# Patient Record
Sex: Female | Born: 1950 | Race: White | Hispanic: No | Smoking: Former smoker
Health system: Southern US, Community
[De-identification: ages and names within clinical notes are randomized; demographics above are authoritative.]

## PROBLEM LIST (undated history)

## (undated) DIAGNOSIS — H5440 Blindness, one eye, unspecified eye: Secondary | ICD-10-CM

## (undated) DIAGNOSIS — R87619 Unspecified abnormal cytological findings in specimens from cervix uteri: Secondary | ICD-10-CM

## (undated) DIAGNOSIS — M1990 Unspecified osteoarthritis, unspecified site: Secondary | ICD-10-CM

## (undated) DIAGNOSIS — E039 Hypothyroidism, unspecified: Secondary | ICD-10-CM

## (undated) DIAGNOSIS — C801 Malignant (primary) neoplasm, unspecified: Secondary | ICD-10-CM

## (undated) DIAGNOSIS — K219 Gastro-esophageal reflux disease without esophagitis: Secondary | ICD-10-CM

## (undated) DIAGNOSIS — G43109 Migraine with aura, not intractable, without status migrainosus: Secondary | ICD-10-CM

## (undated) DIAGNOSIS — E079 Disorder of thyroid, unspecified: Secondary | ICD-10-CM

## (undated) HISTORY — DX: Blindness, one eye, unspecified eye: H54.40

## (undated) HISTORY — DX: Unspecified abnormal cytological findings in specimens from cervix uteri: R87.619

## (undated) HISTORY — DX: Migraine with aura, not intractable, without status migrainosus: G43.109

## (undated) HISTORY — DX: Disorder of thyroid, unspecified: E07.9

---

## 1987-02-27 DIAGNOSIS — C801 Malignant (primary) neoplasm, unspecified: Secondary | ICD-10-CM

## 1987-02-27 HISTORY — DX: Malignant (primary) neoplasm, unspecified: C80.1

## 1987-02-27 HISTORY — PX: GYNECOLOGIC CRYOSURGERY: SHX857

## 1988-02-27 DIAGNOSIS — R87619 Unspecified abnormal cytological findings in specimens from cervix uteri: Secondary | ICD-10-CM

## 1988-02-27 HISTORY — DX: Unspecified abnormal cytological findings in specimens from cervix uteri: R87.619

## 1999-02-27 HISTORY — PX: OTHER SURGICAL HISTORY: SHX169

## 2003-02-16 ENCOUNTER — Ambulatory Visit (HOSPITAL_COMMUNITY): Admission: RE | Admit: 2003-02-16 | Discharge: 2003-02-16 | Payer: Self-pay | Admitting: Family Medicine

## 2003-06-23 ENCOUNTER — Other Ambulatory Visit: Admission: RE | Admit: 2003-06-23 | Discharge: 2003-06-23 | Payer: Self-pay | Admitting: Obstetrics and Gynecology

## 2003-06-23 LAB — HM PAP SMEAR

## 2004-03-20 ENCOUNTER — Ambulatory Visit (HOSPITAL_COMMUNITY): Admission: RE | Admit: 2004-03-20 | Discharge: 2004-03-20 | Payer: Self-pay | Admitting: Obstetrics and Gynecology

## 2005-05-01 ENCOUNTER — Ambulatory Visit (HOSPITAL_COMMUNITY): Admission: RE | Admit: 2005-05-01 | Discharge: 2005-05-01 | Payer: Self-pay | Admitting: Obstetrics and Gynecology

## 2005-06-12 ENCOUNTER — Other Ambulatory Visit: Admission: RE | Admit: 2005-06-12 | Discharge: 2005-06-12 | Payer: Self-pay | Admitting: Obstetrics and Gynecology

## 2005-07-27 HISTORY — PX: AUGMENTATION MAMMAPLASTY: SUR837

## 2005-08-11 ENCOUNTER — Ambulatory Visit: Admission: RE | Admit: 2005-08-11 | Discharge: 2005-08-11 | Payer: Self-pay | Admitting: Emergency Medicine

## 2006-05-07 ENCOUNTER — Ambulatory Visit (HOSPITAL_COMMUNITY): Admission: RE | Admit: 2006-05-07 | Discharge: 2006-05-07 | Payer: Self-pay | Admitting: Obstetrics and Gynecology

## 2007-05-08 ENCOUNTER — Ambulatory Visit (HOSPITAL_COMMUNITY): Admission: RE | Admit: 2007-05-08 | Discharge: 2007-05-08 | Payer: Self-pay | Admitting: Obstetrics and Gynecology

## 2008-06-25 ENCOUNTER — Ambulatory Visit (HOSPITAL_COMMUNITY): Admission: RE | Admit: 2008-06-25 | Discharge: 2008-06-25 | Payer: Self-pay | Admitting: Internal Medicine

## 2008-06-26 LAB — HM COLONOSCOPY

## 2009-09-16 ENCOUNTER — Ambulatory Visit (HOSPITAL_COMMUNITY): Admission: RE | Admit: 2009-09-16 | Discharge: 2009-09-16 | Payer: Self-pay | Admitting: Obstetrics and Gynecology

## 2010-03-19 ENCOUNTER — Encounter: Payer: Self-pay | Admitting: Obstetrics and Gynecology

## 2010-10-06 ENCOUNTER — Other Ambulatory Visit: Payer: Self-pay | Admitting: Obstetrics and Gynecology

## 2010-10-06 DIAGNOSIS — Z1231 Encounter for screening mammogram for malignant neoplasm of breast: Secondary | ICD-10-CM

## 2010-10-24 ENCOUNTER — Ambulatory Visit (HOSPITAL_COMMUNITY)
Admission: RE | Admit: 2010-10-24 | Discharge: 2010-10-24 | Disposition: A | Discharge status: Home / Self Care | Payer: BC Managed Care – PPO | Source: Ambulatory Visit | Attending: Obstetrics and Gynecology | Admitting: Obstetrics and Gynecology

## 2010-10-24 DIAGNOSIS — Z1231 Encounter for screening mammogram for malignant neoplasm of breast: Secondary | ICD-10-CM

## 2011-04-27 DIAGNOSIS — G43109 Migraine with aura, not intractable, without status migrainosus: Secondary | ICD-10-CM

## 2011-04-27 HISTORY — DX: Migraine with aura, not intractable, without status migrainosus: G43.109

## 2011-11-28 ENCOUNTER — Other Ambulatory Visit: Payer: Self-pay | Admitting: Obstetrics and Gynecology

## 2011-11-28 DIAGNOSIS — Z1231 Encounter for screening mammogram for malignant neoplasm of breast: Secondary | ICD-10-CM

## 2011-12-14 ENCOUNTER — Ambulatory Visit (HOSPITAL_COMMUNITY)
Admission: RE | Admit: 2011-12-14 | Discharge: 2011-12-14 | Disposition: A | Discharge status: Home / Self Care | Payer: BC Managed Care – PPO | Source: Ambulatory Visit | Attending: Obstetrics and Gynecology | Admitting: Obstetrics and Gynecology

## 2011-12-14 DIAGNOSIS — Z1231 Encounter for screening mammogram for malignant neoplasm of breast: Secondary | ICD-10-CM | POA: Insufficient documentation

## 2011-12-14 LAB — HM MAMMOGRAPHY

## 2012-11-28 ENCOUNTER — Encounter: Payer: Self-pay | Admitting: Obstetrics and Gynecology

## 2012-11-28 ENCOUNTER — Ambulatory Visit: Payer: Self-pay | Admitting: Obstetrics and Gynecology

## 2012-12-05 ENCOUNTER — Other Ambulatory Visit (HOSPITAL_COMMUNITY): Payer: Self-pay | Admitting: Internal Medicine

## 2012-12-05 DIAGNOSIS — Z1231 Encounter for screening mammogram for malignant neoplasm of breast: Secondary | ICD-10-CM

## 2012-12-16 ENCOUNTER — Telehealth: Payer: Self-pay | Admitting: Obstetrics and Gynecology

## 2012-12-16 NOTE — Telephone Encounter (Signed)
I have taken care of her NS charge. I was wondering if any other patients have mentioned this about choosing the option to cancel but still getting NS charge?

## 2012-12-16 NOTE — Telephone Encounter (Signed)
The log of calls including any cancel prompts on the reminder call system comes to Arna Medici and me in a report by email daily. We then cancel the appointment from that report and call the patient to reschedule. For some reason, I do not have a report for that dos??? But for future reference, we have a log where we keep the reports.

## 2012-12-16 NOTE — Telephone Encounter (Signed)
Patient is calling about a bill she received for a no show charge on 10/03 she said when the automated system called she prompted to cancel the appt but it didn't cancel in our system.

## 2012-12-24 ENCOUNTER — Ambulatory Visit (HOSPITAL_COMMUNITY)
Admission: RE | Admit: 2012-12-24 | Discharge: 2012-12-24 | Disposition: A | Discharge status: Home / Self Care | Payer: BC Managed Care – PPO | Source: Ambulatory Visit | Attending: Internal Medicine | Admitting: Internal Medicine

## 2012-12-24 DIAGNOSIS — Z1231 Encounter for screening mammogram for malignant neoplasm of breast: Secondary | ICD-10-CM

## 2013-10-26 ENCOUNTER — Other Ambulatory Visit: Payer: Self-pay | Admitting: Gastroenterology

## 2013-10-29 ENCOUNTER — Encounter (HOSPITAL_COMMUNITY): Payer: Self-pay | Admitting: Pharmacy Technician

## 2013-11-03 ENCOUNTER — Encounter (HOSPITAL_COMMUNITY): Payer: Self-pay | Admitting: *Deleted

## 2013-11-11 NOTE — Anesthesia Preprocedure Evaluation (Addendum)
Anesthesia Evaluation  Patient identified by MRN, date of birth, ID band Patient awake    Reviewed: Allergy & Precautions, H&P , NPO status , Patient's Chart, lab work & pertinent test results  History of Anesthesia Complications Negative for: history of anesthetic complications  Airway Mallampati: II TM Distance: >3 FB Neck ROM: Full    Dental no notable dental hx.    Pulmonary former smoker,  breath sounds clear to auscultation  Pulmonary exam normal       Cardiovascular negative cardio ROS  Rhythm:Regular Rate:Normal     Neuro/Psych negative neurological ROS  negative psych ROS   GI/Hepatic Neg liver ROS, GERD-  Medicated and Controlled,  Endo/Other  Hypothyroidism   Renal/GU negative Renal ROS  negative genitourinary   Musculoskeletal  (+) Arthritis -, Osteoarthritis,    Abdominal   Peds negative pediatric ROS (+)  Hematology negative hematology ROS (+)   Anesthesia Other Findings   Reproductive/Obstetrics negative OB ROS                          Anesthesia Physical Anesthesia Plan  ASA: II  Anesthesia Plan: MAC   Post-op Pain Management:    Induction: Intravenous  Airway Management Planned: Nasal Cannula  Additional Equipment:   Intra-op Plan:   Post-operative Plan:   Informed Consent: I have reviewed the patients History and Physical, chart, labs and discussed the procedure including the risks, benefits and alternatives for the proposed anesthesia with the patient or authorized representative who has indicated his/her understanding and acceptance.   Dental advisory given  Plan Discussed with: CRNA  Anesthesia Plan Comments:         Anesthesia Quick Evaluation

## 2013-11-12 ENCOUNTER — Encounter (HOSPITAL_COMMUNITY): Payer: BC Managed Care – PPO | Admitting: Anesthesiology

## 2013-11-12 ENCOUNTER — Ambulatory Visit (HOSPITAL_COMMUNITY): Payer: BC Managed Care – PPO | Admitting: Anesthesiology

## 2013-11-12 ENCOUNTER — Encounter (HOSPITAL_COMMUNITY)
Admission: RE | Disposition: A | Discharge status: Home / Self Care | Payer: Self-pay | Source: Ambulatory Visit | Attending: Gastroenterology

## 2013-11-12 ENCOUNTER — Encounter (HOSPITAL_COMMUNITY): Payer: Self-pay

## 2013-11-12 ENCOUNTER — Ambulatory Visit (HOSPITAL_COMMUNITY)
Admission: RE | Admit: 2013-11-12 | Discharge: 2013-11-12 | Disposition: A | Discharge status: Home / Self Care | Payer: BC Managed Care – PPO | Source: Ambulatory Visit | Attending: Gastroenterology | Admitting: Gastroenterology

## 2013-11-12 DIAGNOSIS — M129 Arthropathy, unspecified: Secondary | ICD-10-CM | POA: Diagnosis not present

## 2013-11-12 DIAGNOSIS — E039 Hypothyroidism, unspecified: Secondary | ICD-10-CM | POA: Diagnosis not present

## 2013-11-12 DIAGNOSIS — Z860100 Personal history of colon polyps, unspecified: Secondary | ICD-10-CM | POA: Insufficient documentation

## 2013-11-12 DIAGNOSIS — Z1211 Encounter for screening for malignant neoplasm of colon: Secondary | ICD-10-CM | POA: Diagnosis present

## 2013-11-12 DIAGNOSIS — Z87891 Personal history of nicotine dependence: Secondary | ICD-10-CM | POA: Diagnosis not present

## 2013-11-12 DIAGNOSIS — D126 Benign neoplasm of colon, unspecified: Secondary | ICD-10-CM | POA: Insufficient documentation

## 2013-11-12 HISTORY — DX: Gastro-esophageal reflux disease without esophagitis: K21.9

## 2013-11-12 HISTORY — PX: COLONOSCOPY WITH PROPOFOL: SHX5780

## 2013-11-12 HISTORY — DX: Malignant (primary) neoplasm, unspecified: C80.1

## 2013-11-12 HISTORY — DX: Hypothyroidism, unspecified: E03.9

## 2013-11-12 HISTORY — PX: OTHER SURGICAL HISTORY: SHX169

## 2013-11-12 HISTORY — DX: Unspecified osteoarthritis, unspecified site: M19.90

## 2013-11-12 LAB — HM COLONOSCOPY

## 2013-11-12 SURGERY — COLONOSCOPY WITH PROPOFOL
Anesthesia: Monitor Anesthesia Care

## 2013-11-12 MED ORDER — PROPOFOL 10 MG/ML IV BOLUS
INTRAVENOUS | Status: DC | PRN
  Administered 2013-11-12: 50 mg via INTRAVENOUS

## 2013-11-12 MED ORDER — SODIUM CHLORIDE 0.9 % IV SOLN: INTRAVENOUS | Status: DC

## 2013-11-12 MED ORDER — LIDOCAINE HCL (CARDIAC) 20 MG/ML IV SOLN
INTRAVENOUS | Status: DC | PRN
  Administered 2013-11-12: 50 mg via INTRAVENOUS

## 2013-11-12 MED ORDER — PROPOFOL INFUSION 10 MG/ML OPTIME
INTRAVENOUS | Status: DC | PRN
  Administered 2013-11-12: 120 ug/kg/min via INTRAVENOUS

## 2013-11-12 MED ORDER — LACTATED RINGERS IV SOLN
INTRAVENOUS | Status: DC
  Administered 2013-11-12: 10:00:00 via INTRAVENOUS

## 2013-11-12 MED ORDER — PROPOFOL 10 MG/ML IV BOLUS
INTRAVENOUS | Status: AC
  Filled 2013-11-12: qty 20

## 2013-11-12 MED ORDER — LIDOCAINE HCL (CARDIAC) 20 MG/ML IV SOLN
INTRAVENOUS | Status: AC
  Filled 2013-11-12: qty 5

## 2013-11-12 SURGICAL SUPPLY — 21 items

## 2013-11-12 NOTE — Anesthesia Postprocedure Evaluation (Signed)
  Anesthesia Post-op Note  Patient: Kristin Gentry  Procedure(s) Performed: Procedure(s) (LRB): COLONOSCOPY WITH PROPOFOL (N/A)  Patient Location: PACU  Anesthesia Type: MAC  Level of Consciousness: awake and alert   Airway and Oxygen Therapy: Patient Spontanous Breathing  Post-op Pain: mild  Post-op Assessment: Post-op Vital signs reviewed, Patient's Cardiovascular Status Stable, Respiratory Function Stable, Patent Airway and No signs of Nausea or vomiting  Last Vitals:  Filed Vitals:   11/12/13 1137  BP: 122/99  Pulse: 91  Temp:   Resp: 18    Post-op Vital Signs: stable   Complications: No apparent anesthesia complications

## 2013-11-12 NOTE — Op Note (Signed)
Procedure: Surveillance colonoscopy. Normal surveillance colonoscopy performed on 07/30/2008. 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  Endoscopist: Earle Gell  Premedication: Propofol administered by anesthesia  Procedure: The patient was placed in the left lateral decubitus position. Anal inspection and digital rectal exam were normal. The Pentax pediatric colonoscope was introduced into the rectum and advanced to the cecum. A normal-appearing appendiceal orifice was identified. A normal-appearing ileocecal valve was identified. Colonic preparation for the exam today was good.  Rectum. Normal. Retroflex view of the distal rectum normal  Sigmoid colon. From the mid sigmoid colon, a 4 mm sessile polyp was removed with the cold snare and cold biopsy forceps.  Descending colon. Normal  Splenic flexure. Normal  Transverse colon. From the mid transverse colon, two  5 mm sessile polyps were removed with the cold snare.  Hepatic flexure. Normal  Ascending colon. Normal  Cecum and ileocecal valve. Normal  Assessment: A 4 mm sessile polyp was removed from the sigmoid colon and two 5 mm sessile polyps were removed from the mid transverse colon. Otherwise normal colonoscopy.  Recommendation: Schedule repeat surveillance colonoscopy in 5 years

## 2013-11-12 NOTE — H&P (Signed)
  Procedure: Surveillance colonoscopy. Normal surveillance colonoscopy performed on 6/4/210. 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  History: The patient is a 63 year old female born [birth year]. She is scheduled to undergo a surveillance colonoscopy today. In 2007, she underwent a colonoscopy with removal of a rectal tubulovillous adenomatous polyp which was complicated by a post colonoscopic polypectomy bleed. She underwent a normal surveillance colonoscopy in June 2010.  Past medical history: Hypothyroidism. Osteopenia. Cervical cancer. Total abdominal hysterectomy. Bilateral salpingo-oophorectomy. Saline breast implants.  Medication allergies: None  Exam: The patient is alert and lying comfortably on the endoscopy stretcher. Abdomen is soft and nontender to palpation. Lungs are clear to auscultation. Cardiac exam reveals a regular rhythm.  Plan: Proceed with surveillance colonoscopy

## 2013-11-12 NOTE — Discharge Instructions (Signed)

## 2013-11-12 NOTE — Transfer of Care (Signed)
Immediate Anesthesia Transfer of Care Note  Patient: Kristin Gentry  Procedure(s) Performed: Procedure(s): COLONOSCOPY WITH PROPOFOL (N/A)  Patient Location: PACU  Anesthesia Type:MAC  Level of Consciousness: awake, alert  and oriented  Airway & Oxygen Therapy: Patient Spontanous Breathing and Patient connected to face mask oxygen  Post-op Assessment: Report given to PACU RN and Post -op Vital signs reviewed and stable  Post vital signs: Reviewed and stable  Complications: No apparent anesthesia complications

## 2013-11-13 ENCOUNTER — Encounter (HOSPITAL_COMMUNITY): Payer: Self-pay | Admitting: Gastroenterology

## 2013-12-28 ENCOUNTER — Encounter (HOSPITAL_COMMUNITY): Payer: Self-pay | Admitting: Gastroenterology

## 2015-12-13 ENCOUNTER — Other Ambulatory Visit: Payer: Self-pay | Admitting: Internal Medicine

## 2015-12-13 DIAGNOSIS — Z1231 Encounter for screening mammogram for malignant neoplasm of breast: Secondary | ICD-10-CM

## 2015-12-13 DIAGNOSIS — Z9882 Breast implant status: Secondary | ICD-10-CM

## 2015-12-29 ENCOUNTER — Ambulatory Visit
Admission: RE | Admit: 2015-12-29 | Discharge: 2015-12-29 | Disposition: A | Discharge status: Home / Self Care | Payer: BLUE CROSS/BLUE SHIELD | Source: Ambulatory Visit | Attending: Internal Medicine | Admitting: Internal Medicine

## 2015-12-29 DIAGNOSIS — Z9882 Breast implant status: Secondary | ICD-10-CM

## 2015-12-29 DIAGNOSIS — Z1231 Encounter for screening mammogram for malignant neoplasm of breast: Secondary | ICD-10-CM

## 2016-11-26 ENCOUNTER — Other Ambulatory Visit: Payer: Self-pay | Admitting: Internal Medicine

## 2016-11-26 DIAGNOSIS — Z1239 Encounter for other screening for malignant neoplasm of breast: Secondary | ICD-10-CM

## 2016-12-03 DIAGNOSIS — M810 Age-related osteoporosis without current pathological fracture: Secondary | ICD-10-CM | POA: Diagnosis not present

## 2016-12-03 DIAGNOSIS — M8588 Other specified disorders of bone density and structure, other site: Secondary | ICD-10-CM | POA: Diagnosis not present

## 2016-12-11 DIAGNOSIS — M810 Age-related osteoporosis without current pathological fracture: Secondary | ICD-10-CM | POA: Diagnosis not present

## 2016-12-20 DIAGNOSIS — M810 Age-related osteoporosis without current pathological fracture: Secondary | ICD-10-CM | POA: Diagnosis not present

## 2016-12-31 ENCOUNTER — Ambulatory Visit
Admission: RE | Admit: 2016-12-31 | Discharge: 2016-12-31 | Disposition: A | Discharge status: Home / Self Care | Payer: Medicare Other | Source: Ambulatory Visit | Attending: Internal Medicine | Admitting: Internal Medicine

## 2016-12-31 DIAGNOSIS — Z1231 Encounter for screening mammogram for malignant neoplasm of breast: Secondary | ICD-10-CM | POA: Diagnosis not present

## 2016-12-31 DIAGNOSIS — Z1239 Encounter for other screening for malignant neoplasm of breast: Secondary | ICD-10-CM

## 2017-01-03 DIAGNOSIS — S52225D Nondisplaced transverse fracture of shaft of left ulna, subsequent encounter for closed fracture with routine healing: Secondary | ICD-10-CM | POA: Diagnosis not present

## 2017-01-03 DIAGNOSIS — M25532 Pain in left wrist: Secondary | ICD-10-CM | POA: Diagnosis not present

## 2017-04-12 DIAGNOSIS — J029 Acute pharyngitis, unspecified: Secondary | ICD-10-CM | POA: Diagnosis not present

## 2017-05-14 DIAGNOSIS — R69 Illness, unspecified: Secondary | ICD-10-CM | POA: Diagnosis not present

## 2017-05-19 DIAGNOSIS — Z0100 Encounter for examination of eyes and vision without abnormal findings: Secondary | ICD-10-CM | POA: Diagnosis not present

## 2017-07-16 ENCOUNTER — Observation Stay (HOSPITAL_COMMUNITY): Payer: Medicare HMO | Admitting: Certified Registered Nurse Anesthetist

## 2017-07-16 ENCOUNTER — Encounter (HOSPITAL_COMMUNITY): Admission: EM | Disposition: A | Discharge status: Home / Self Care | Payer: Self-pay | Source: Home / Self Care

## 2017-07-16 ENCOUNTER — Inpatient Hospital Stay (HOSPITAL_COMMUNITY)
Admission: EM | Admit: 2017-07-16 | Discharge: 2017-07-20 | DRG: 342 | Disposition: A | Discharge status: Home / Self Care | Payer: Medicare HMO | Attending: Surgery | Admitting: Surgery

## 2017-07-16 ENCOUNTER — Other Ambulatory Visit: Payer: Self-pay | Admitting: Internal Medicine

## 2017-07-16 ENCOUNTER — Emergency Department (HOSPITAL_COMMUNITY): Payer: Medicare HMO

## 2017-07-16 ENCOUNTER — Encounter (HOSPITAL_COMMUNITY): Payer: Self-pay

## 2017-07-16 ENCOUNTER — Other Ambulatory Visit: Payer: Self-pay

## 2017-07-16 DIAGNOSIS — M1712 Unilateral primary osteoarthritis, left knee: Secondary | ICD-10-CM | POA: Diagnosis present

## 2017-07-16 DIAGNOSIS — K3532 Acute appendicitis with perforation and localized peritonitis, without abscess: Secondary | ICD-10-CM | POA: Diagnosis not present

## 2017-07-16 DIAGNOSIS — Z90710 Acquired absence of both cervix and uterus: Secondary | ICD-10-CM

## 2017-07-16 DIAGNOSIS — E039 Hypothyroidism, unspecified: Secondary | ICD-10-CM | POA: Diagnosis present

## 2017-07-16 DIAGNOSIS — Z833 Family history of diabetes mellitus: Secondary | ICD-10-CM | POA: Diagnosis not present

## 2017-07-16 DIAGNOSIS — K352 Acute appendicitis with generalized peritonitis, without abscess: Secondary | ICD-10-CM | POA: Diagnosis not present

## 2017-07-16 DIAGNOSIS — K449 Diaphragmatic hernia without obstruction or gangrene: Secondary | ICD-10-CM | POA: Diagnosis not present

## 2017-07-16 DIAGNOSIS — R1011 Right upper quadrant pain: Secondary | ICD-10-CM | POA: Diagnosis not present

## 2017-07-16 DIAGNOSIS — Z79890 Hormone replacement therapy (postmenopausal): Secondary | ICD-10-CM | POA: Diagnosis not present

## 2017-07-16 DIAGNOSIS — R1030 Lower abdominal pain, unspecified: Secondary | ICD-10-CM | POA: Diagnosis not present

## 2017-07-16 DIAGNOSIS — Z8541 Personal history of malignant neoplasm of cervix uteri: Secondary | ICD-10-CM

## 2017-07-16 DIAGNOSIS — Z8249 Family history of ischemic heart disease and other diseases of the circulatory system: Secondary | ICD-10-CM | POA: Diagnosis not present

## 2017-07-16 DIAGNOSIS — K3580 Unspecified acute appendicitis: Secondary | ICD-10-CM

## 2017-07-16 DIAGNOSIS — K3533 Acute appendicitis with perforation and localized peritonitis, with abscess: Secondary | ICD-10-CM

## 2017-07-16 DIAGNOSIS — Z8349 Family history of other endocrine, nutritional and metabolic diseases: Secondary | ICD-10-CM | POA: Diagnosis not present

## 2017-07-16 DIAGNOSIS — H5462 Unqualified visual loss, left eye, normal vision right eye: Secondary | ICD-10-CM | POA: Diagnosis not present

## 2017-07-16 DIAGNOSIS — K219 Gastro-esophageal reflux disease without esophagitis: Secondary | ICD-10-CM | POA: Diagnosis not present

## 2017-07-16 DIAGNOSIS — K567 Ileus, unspecified: Secondary | ICD-10-CM | POA: Diagnosis not present

## 2017-07-16 DIAGNOSIS — Z87891 Personal history of nicotine dependence: Secondary | ICD-10-CM

## 2017-07-16 DIAGNOSIS — R1931 Right upper quadrant abdominal rigidity: Secondary | ICD-10-CM | POA: Diagnosis not present

## 2017-07-16 DIAGNOSIS — R1031 Right lower quadrant pain: Secondary | ICD-10-CM | POA: Diagnosis not present

## 2017-07-16 DIAGNOSIS — R110 Nausea: Secondary | ICD-10-CM | POA: Diagnosis not present

## 2017-07-16 HISTORY — DX: Unspecified acute appendicitis: K35.80

## 2017-07-16 HISTORY — PX: LAPAROSCOPIC APPENDECTOMY: SHX408

## 2017-07-16 LAB — CBC
HEMATOCRIT: 45.1 % (ref 36.0–46.0)
HEMOGLOBIN: 15.5 g/dL — AB (ref 12.0–15.0)
MCH: 34.1 pg — ABNORMAL HIGH (ref 26.0–34.0)
MCHC: 34.4 g/dL (ref 30.0–36.0)
MCV: 99.3 fL (ref 78.0–100.0)
Platelets: 294 10*3/uL (ref 150–400)
RBC: 4.54 MIL/uL (ref 3.87–5.11)
RDW: 12.7 % (ref 11.5–15.5)
WBC: 4.5 10*3/uL (ref 4.0–10.5)

## 2017-07-16 LAB — URINALYSIS, ROUTINE W REFLEX MICROSCOPIC
BILIRUBIN URINE: NEGATIVE
Glucose, UA: NEGATIVE mg/dL
Hgb urine dipstick: NEGATIVE
KETONES UR: 20 mg/dL — AB
Leukocytes, UA: NEGATIVE
NITRITE: NEGATIVE
PROTEIN: NEGATIVE mg/dL
pH: 5 (ref 5.0–8.0)

## 2017-07-16 LAB — COMPREHENSIVE METABOLIC PANEL
ALBUMIN: 4.1 g/dL (ref 3.5–5.0)
ALT: 64 U/L — ABNORMAL HIGH (ref 14–54)
ANION GAP: 19 — AB (ref 5–15)
AST: 43 U/L — ABNORMAL HIGH (ref 15–41)
Alkaline Phosphatase: 61 U/L (ref 38–126)
BILIRUBIN TOTAL: 1.9 mg/dL — AB (ref 0.3–1.2)
BUN: 14 mg/dL (ref 6–20)
CHLORIDE: 98 mmol/L — AB (ref 101–111)
CO2: 19 mmol/L — AB (ref 22–32)
Calcium: 9.4 mg/dL (ref 8.9–10.3)
Creatinine, Ser: 1.11 mg/dL — ABNORMAL HIGH (ref 0.44–1.00)
GFR calc Af Amer: 59 mL/min — ABNORMAL LOW (ref >60)
GFR calc non Af Amer: 51 mL/min — ABNORMAL LOW (ref >60)
Glucose, Bld: 108 mg/dL — ABNORMAL HIGH (ref 65–99)
POTASSIUM: 4.1 mmol/L (ref 3.5–5.1)
SODIUM: 136 mmol/L (ref 135–145)
TOTAL PROTEIN: 7.6 g/dL (ref 6.5–8.1)

## 2017-07-16 LAB — LIPASE, BLOOD: LIPASE: 20 U/L (ref 11–51)

## 2017-07-16 SURGERY — APPENDECTOMY, LAPAROSCOPIC
Anesthesia: General | Site: Abdomen

## 2017-07-16 MED ORDER — ACETAMINOPHEN 650 MG RE SUPP: 650.0000 mg | Freq: Four times a day (QID) | RECTAL | Status: DC | PRN

## 2017-07-16 MED ORDER — BUPIVACAINE-EPINEPHRINE (PF) 0.25% -1:200000 IJ SOLN
INTRAMUSCULAR | Status: AC
  Filled 2017-07-16: qty 30

## 2017-07-16 MED ORDER — LIDOCAINE 2% (20 MG/ML) 5 ML SYRINGE
INTRAMUSCULAR | Status: AC
  Filled 2017-07-16: qty 5

## 2017-07-16 MED ORDER — FENTANYL CITRATE (PF) 100 MCG/2ML IJ SOLN
INTRAMUSCULAR | Status: AC
  Filled 2017-07-16: qty 2

## 2017-07-16 MED ORDER — DEXAMETHASONE SODIUM PHOSPHATE 10 MG/ML IJ SOLN
INTRAMUSCULAR | Status: AC
  Filled 2017-07-16: qty 1

## 2017-07-16 MED ORDER — SUGAMMADEX SODIUM 200 MG/2ML IV SOLN
INTRAVENOUS | Status: AC
  Filled 2017-07-16: qty 2

## 2017-07-16 MED ORDER — KCL IN DEXTROSE-NACL 20-5-0.45 MEQ/L-%-% IV SOLN
INTRAVENOUS | Status: DC
  Administered 2017-07-16: 1000 mL via INTRAVENOUS
  Administered 2017-07-17 – 2017-07-19 (×3): via INTRAVENOUS
  Filled 2017-07-16 (×4): qty 1000

## 2017-07-16 MED ORDER — TRAMADOL HCL 50 MG PO TABS: 50.0000 mg | ORAL_TABLET | Freq: Four times a day (QID) | ORAL | Status: DC | PRN

## 2017-07-16 MED ORDER — SUCCINYLCHOLINE CHLORIDE 200 MG/10ML IV SOSY
PREFILLED_SYRINGE | INTRAVENOUS | Status: DC | PRN
  Administered 2017-07-16: 120 mg via INTRAVENOUS

## 2017-07-16 MED ORDER — ONDANSETRON HCL 4 MG/2ML IJ SOLN
INTRAMUSCULAR | Status: AC
  Filled 2017-07-16: qty 2

## 2017-07-16 MED ORDER — ROCURONIUM BROMIDE 10 MG/ML (PF) SYRINGE
PREFILLED_SYRINGE | INTRAVENOUS | Status: AC
  Filled 2017-07-16: qty 5

## 2017-07-16 MED ORDER — PROPOFOL 10 MG/ML IV BOLUS
INTRAVENOUS | Status: DC | PRN
  Administered 2017-07-16: 130 mg via INTRAVENOUS

## 2017-07-16 MED ORDER — HYDROMORPHONE HCL 1 MG/ML IJ SOLN
0.5000 mg | INTRAMUSCULAR | Status: DC | PRN
  Administered 2017-07-16 (×2): 0.5 mg via INTRAVENOUS
  Filled 2017-07-16 (×2): qty 1

## 2017-07-16 MED ORDER — DEXAMETHASONE SODIUM PHOSPHATE 10 MG/ML IJ SOLN
INTRAMUSCULAR | Status: DC | PRN
  Administered 2017-07-16: 8 mg via INTRAVENOUS

## 2017-07-16 MED ORDER — SODIUM CHLORIDE 0.9 % IV SOLN
INTRAVENOUS | Status: DC
  Administered 2017-07-16: 125 mL/h via INTRAVENOUS

## 2017-07-16 MED ORDER — LACTATED RINGERS IV SOLN
INTRAVENOUS | Status: DC
  Administered 2017-07-16: 19:00:00 via INTRAVENOUS

## 2017-07-16 MED ORDER — BUPIVACAINE-EPINEPHRINE 0.25% -1:200000 IJ SOLN
INTRAMUSCULAR | Status: DC | PRN
  Administered 2017-07-16: 20 mL

## 2017-07-16 MED ORDER — MIDAZOLAM HCL 2 MG/2ML IJ SOLN
INTRAMUSCULAR | Status: AC
  Filled 2017-07-16: qty 2

## 2017-07-16 MED ORDER — LEVOTHYROXINE SODIUM 88 MCG PO TABS
88.0000 ug | ORAL_TABLET | Freq: Every day | ORAL | Status: DC
  Administered 2017-07-17 – 2017-07-20 (×4): 88 ug via ORAL
  Filled 2017-07-16 (×4): qty 1

## 2017-07-16 MED ORDER — HYDROMORPHONE HCL 1 MG/ML IJ SOLN: 0.2500 mg | INTRAMUSCULAR | Status: DC | PRN

## 2017-07-16 MED ORDER — 0.9 % SODIUM CHLORIDE (POUR BTL) OPTIME
TOPICAL | Status: DC | PRN
  Administered 2017-07-16: 1000 mL

## 2017-07-16 MED ORDER — MEPERIDINE HCL 50 MG/ML IJ SOLN: 6.2500 mg | INTRAMUSCULAR | Status: DC | PRN

## 2017-07-16 MED ORDER — IOPAMIDOL (ISOVUE-300) INJECTION 61%
100.0000 mL | Freq: Once | INTRAVENOUS | Status: AC | PRN
  Administered 2017-07-16: 100 mL via INTRAVENOUS

## 2017-07-16 MED ORDER — MIDAZOLAM HCL 5 MG/5ML IJ SOLN
INTRAMUSCULAR | Status: DC | PRN
  Administered 2017-07-16: 2 mg via INTRAVENOUS

## 2017-07-16 MED ORDER — SUCCINYLCHOLINE CHLORIDE 200 MG/10ML IV SOSY
PREFILLED_SYRINGE | INTRAVENOUS | Status: AC
  Filled 2017-07-16: qty 10

## 2017-07-16 MED ORDER — ONDANSETRON 4 MG PO TBDP: 4.0000 mg | ORAL_TABLET | Freq: Once | ORAL | Status: DC | PRN

## 2017-07-16 MED ORDER — ROCURONIUM BROMIDE 10 MG/ML (PF) SYRINGE
PREFILLED_SYRINGE | INTRAVENOUS | Status: DC | PRN
  Administered 2017-07-16: 30 mg via INTRAVENOUS

## 2017-07-16 MED ORDER — PIPERACILLIN-TAZOBACTAM 3.375 G IVPB 30 MIN
3.3750 g | Freq: Once | INTRAVENOUS | Status: AC
  Administered 2017-07-16: 3.375 g via INTRAVENOUS
  Filled 2017-07-16: qty 50

## 2017-07-16 MED ORDER — SODIUM CHLORIDE 0.9 % IV BOLUS
1000.0000 mL | Freq: Once | INTRAVENOUS | Status: AC
  Administered 2017-07-16: 1000 mL via INTRAVENOUS

## 2017-07-16 MED ORDER — FENTANYL CITRATE (PF) 100 MCG/2ML IJ SOLN
INTRAMUSCULAR | Status: DC | PRN
  Administered 2017-07-16: 100 ug via INTRAVENOUS
  Administered 2017-07-16 (×2): 50 ug via INTRAVENOUS

## 2017-07-16 MED ORDER — IOPAMIDOL (ISOVUE-300) INJECTION 61%
INTRAVENOUS | Status: AC
  Administered 2017-07-16: 17:00:00
  Filled 2017-07-16: qty 100

## 2017-07-16 MED ORDER — ONDANSETRON HCL 4 MG/2ML IJ SOLN
INTRAMUSCULAR | Status: DC | PRN
  Administered 2017-07-16: 4 mg via INTRAVENOUS

## 2017-07-16 MED ORDER — ONDANSETRON 4 MG PO TBDP
4.0000 mg | ORAL_TABLET | Freq: Four times a day (QID) | ORAL | Status: DC | PRN
  Administered 2017-07-19: 4 mg via ORAL
  Filled 2017-07-16: qty 1

## 2017-07-16 MED ORDER — ENOXAPARIN SODIUM 40 MG/0.4ML ~~LOC~~ SOLN
40.0000 mg | SUBCUTANEOUS | Status: DC
  Administered 2017-07-17 – 2017-07-20 (×4): 40 mg via SUBCUTANEOUS
  Filled 2017-07-16 (×4): qty 0.4

## 2017-07-16 MED ORDER — ONDANSETRON HCL 4 MG/2ML IJ SOLN
4.0000 mg | Freq: Once | INTRAMUSCULAR | Status: AC
  Administered 2017-07-16: 4 mg via INTRAVENOUS
  Filled 2017-07-16: qty 2

## 2017-07-16 MED ORDER — LIDOCAINE 2% (20 MG/ML) 5 ML SYRINGE
INTRAMUSCULAR | Status: DC | PRN
  Administered 2017-07-16: 50 mg via INTRAVENOUS

## 2017-07-16 MED ORDER — ONDANSETRON HCL 4 MG/2ML IJ SOLN: 4.0000 mg | Freq: Once | INTRAMUSCULAR | Status: DC | PRN

## 2017-07-16 MED ORDER — HYDROMORPHONE HCL 1 MG/ML IJ SOLN: 1.0000 mg | INTRAMUSCULAR | Status: DC | PRN

## 2017-07-16 MED ORDER — PIPERACILLIN-TAZOBACTAM 3.375 G IVPB
3.3750 g | Freq: Three times a day (TID) | INTRAVENOUS | Status: DC
  Administered 2017-07-16 – 2017-07-19 (×8): 3.375 g via INTRAVENOUS
  Filled 2017-07-16 (×7): qty 50

## 2017-07-16 MED ORDER — ACETAMINOPHEN 325 MG PO TABS: 650.0000 mg | ORAL_TABLET | Freq: Four times a day (QID) | ORAL | Status: DC | PRN

## 2017-07-16 MED ORDER — ONDANSETRON HCL 4 MG/2ML IJ SOLN: 4.0000 mg | Freq: Four times a day (QID) | INTRAMUSCULAR | Status: DC | PRN

## 2017-07-16 MED ORDER — PROPOFOL 10 MG/ML IV BOLUS
INTRAVENOUS | Status: AC
  Filled 2017-07-16: qty 20

## 2017-07-16 MED ORDER — HYDROCODONE-ACETAMINOPHEN 5-325 MG PO TABS
1.0000 | ORAL_TABLET | ORAL | Status: DC | PRN
  Administered 2017-07-16 – 2017-07-18 (×5): 1 via ORAL
  Filled 2017-07-16 (×5): qty 1

## 2017-07-16 MED ORDER — LACTATED RINGERS IR SOLN
Status: DC | PRN
  Administered 2017-07-16: 2000 mL

## 2017-07-16 SURGICAL SUPPLY — 34 items
APPLIER CLIP ROT 10 11.4 M/L (STAPLE) ×2
APR CLP MED LRG 11.4X10 (STAPLE) ×1
BAG SPEC RTRVL LRG 6X4 10 (ENDOMECHANICALS) ×1
CHLORAPREP W/TINT 26ML (MISCELLANEOUS) ×2 IMPLANT
CLIP APPLIE ROT 10 11.4 M/L (STAPLE) IMPLANT
COVER SURGICAL LIGHT HANDLE (MISCELLANEOUS) ×2 IMPLANT
CUTTER FLEX LINEAR 45M (STAPLE) IMPLANT
DECANTER SPIKE VIAL GLASS SM (MISCELLANEOUS) ×2 IMPLANT
DRAPE LAPAROSCOPIC ABDOMINAL (DRAPES) ×2 IMPLANT
DRSG TEGADERM 2-3/8X2-3/4 SM (GAUZE/BANDAGES/DRESSINGS) ×1 IMPLANT
ELECT REM PT RETURN 15FT ADLT (MISCELLANEOUS) ×2 IMPLANT
ENDOLOOP SUT PDS II  0 18 (SUTURE)
ENDOLOOP SUT PDS II 0 18 (SUTURE) IMPLANT
GAUZE SPONGE 2X2 8PLY STRL LF (GAUZE/BANDAGES/DRESSINGS) ×1 IMPLANT
GLOVE SURG ORTHO 8.0 STRL STRW (GLOVE) ×2 IMPLANT
GOWN STRL REUS W/TWL XL LVL3 (GOWN DISPOSABLE) ×4 IMPLANT
KIT BASIN OR (CUSTOM PROCEDURE TRAY) ×2 IMPLANT
POUCH SPECIMEN RETRIEVAL 10MM (ENDOMECHANICALS) ×2 IMPLANT
RELOAD 45 VASCULAR/THIN (ENDOMECHANICALS) ×2 IMPLANT
RELOAD STAPLE 45 2.5 WHT GRN (ENDOMECHANICALS) IMPLANT
RELOAD STAPLE 45 3.5 BLU ETS (ENDOMECHANICALS) IMPLANT
RELOAD STAPLE TA45 3.5 REG BLU (ENDOMECHANICALS) ×2 IMPLANT
SET IRRIG TUBING LAPAROSCOPIC (IRRIGATION / IRRIGATOR) ×2 IMPLANT
SHEARS HARMONIC ACE PLUS 36CM (ENDOMECHANICALS) ×2 IMPLANT
SPONGE GAUZE 2X2 STER 10/PKG (GAUZE/BANDAGES/DRESSINGS) ×1
STRIP CLOSURE SKIN 1/2X4 (GAUZE/BANDAGES/DRESSINGS) ×2 IMPLANT
SUT MNCRL AB 4-0 PS2 18 (SUTURE) ×2 IMPLANT
TOWEL OR 17X26 10 PK STRL BLUE (TOWEL DISPOSABLE) ×2 IMPLANT
TOWEL OR NON WOVEN STRL DISP B (DISPOSABLE) ×2 IMPLANT
TRAY FOLEY MTR SLVR 14FR STAT (SET/KITS/TRAYS/PACK) IMPLANT
TRAY FOLEY MTR SLVR 16FR STAT (SET/KITS/TRAYS/PACK) IMPLANT
TRAY LAPAROSCOPIC (CUSTOM PROCEDURE TRAY) ×2 IMPLANT
TROCAR XCEL BLUNT TIP 100MML (ENDOMECHANICALS) ×2 IMPLANT
TROCAR XCEL NON-BLD 11X100MML (ENDOMECHANICALS) ×2 IMPLANT

## 2017-07-16 NOTE — Anesthesia Preprocedure Evaluation (Signed)
Anesthesia Evaluation  Patient identified by MRN, date of birth, ID band Patient awake    Reviewed: Allergy & Precautions, NPO status , Patient's Chart, lab work & pertinent test results  Airway Mallampati: I  TM Distance: >3 FB Neck ROM: Full    Dental   Pulmonary former smoker,    Pulmonary exam normal        Cardiovascular Normal cardiovascular exam     Neuro/Psych    GI/Hepatic GERD  Medicated and Controlled,  Endo/Other    Renal/GU      Musculoskeletal   Abdominal   Peds  Hematology   Anesthesia Other Findings   Reproductive/Obstetrics                             Anesthesia Physical Anesthesia Plan  ASA: II and emergent  Anesthesia Plan: General   Post-op Pain Management:    Induction: Intravenous, Rapid sequence and Cricoid pressure planned  PONV Risk Score and Plan: 3 and Ondansetron and Midazolam  Airway Management Planned: Oral ETT  Additional Equipment:   Intra-op Plan:   Post-operative Plan: Extubation in OR  Informed Consent: I have reviewed the patients History and Physical, chart, labs and discussed the procedure including the risks, benefits and alternatives for the proposed anesthesia with the patient or authorized representative who has indicated his/her understanding and acceptance.     Plan Discussed with: CRNA and Surgeon  Anesthesia Plan Comments:         Anesthesia Quick Evaluation

## 2017-07-16 NOTE — ED Notes (Signed)
Pt was informed that urine is needed

## 2017-07-16 NOTE — H&P (Signed)
Kristin Gentry is an 67 y.o. female.    Chief Complaint: abdominal pain, acute appendicitis  HPI: Patient is a 68 year old female who presents to the emergency department with a 2-day history of abdominal pain.  Pain became more persistent and more sharp in nature.  Patient presented to her primary care physician's office for evaluation.  She was referred to the emergency department for imaging and further evaluation.  Laboratory studies show normal white blood cell count of 4.5.  Patient underwent CT scan abdomen and pelvis showing signs of acute appendicitis with possible perforation and generalized peritonitis.  Patient has previous history of cesarean section, abdominoplasty, liposuction, and breast augmentation.  She is accompanied in the emergency department by her daughter.  Past Medical History:  Diagnosis Date  . Abnormal Pap smear of cervix 1990  . Arthritis    left knee  . 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 Cancer ([place]) 1989   cervical  . GERD (gastroesophageal reflux disease)   . Hypothyroidism   . 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 Thyroid disease    hypothyroid    Past Surgical History:  Procedure Laterality Date  . AUGMENTATION MAMMAPLASTY  07/2005   --saline  . CESAREAN SECTION  1986  . COLONOSCOPY WITH PROPOFOL N/A 11/12/2013   Procedure: COLONOSCOPY WITH PROPOFOL;  Surgeon: Garlan Fair, MD;  Location: [facility];  Service: Endoscopy;  Laterality: N/A;  . [place]   of cervix  . Total Vaginal Hysterectomy/BSO  2001   --fibroids    Family History  Problem Relation Age of Onset  . Hypertension Mother   . Heart disease Mother   . Thyroid disease Mother        hypothyroid  . Diabetes Father   . Hypertension Father   . Heart disease Father   . Thyroid disease Sister        hypothyroid   Social History:  reports that she quit smoking about 14 years ago. Her smoking use included cigarettes. She has a 5.00 pack-year smoking history. She  has never used smokeless tobacco. She reports that she drinks about 1.0 oz of alcohol per week. She reports that she does not use drugs.  Allergies: No Known Allergies   (Not in a hospital admission)  Results for orders placed or performed during the hospital encounter of 07/16/17 (from the past 48 hour(s))  Lipase, blood     Status: None   Collection Time: 07/16/17  1:55 PM  Result Value Ref Range   Lipase 20 11 - 51 U/L    Comment: Performed at [facility], [place] [address], [place],  [address]  Comprehensive metabolic panel     Status: Abnormal   Collection Time: 07/16/17  1:55 PM  Result Value Ref Range   Sodium 136 135 - 145 mmol/L   Potassium 4.1 3.5 - 5.1 mmol/L   Chloride 98 (L) 101 - 111 mmol/L   CO2 19 (L) 22 - 32 mmol/L   Glucose, Bld 108 (H) 65 - 99 mg/dL   BUN 14 6 - 20 mg/dL   Creatinine, Ser 1.11 (H) 0.44 - 1.00 mg/dL   Calcium 9.4 8.9 - 10.3 mg/dL   Total Protein 7.6 6.5 - 8.1 g/dL   Albumin 4.1 3.5 - 5.0 g/dL   AST 43 (H) 15 - 41 U/L   ALT 64 (H) 14 - 54 U/L   Alkaline Phosphatase 61 38 - 126 U/L   Total Bilirubin  1.9 (H) 0.3 - 1.2 mg/dL   GFR calc non Af Amer 51 (L) >60 mL/min   GFR calc Af Amer 59 (L) >60 mL/min    Comment: (NOTE) The eGFR has been calculated using the CKD EPI equation. This calculation has not been validated in all clinical situations. eGFR's persistently <60 mL/min signify possible Chronic Kidney Disease.    Anion gap 19 (H) 5 - 15    Comment: Performed at [facility], [place] [address], [place], [place] [address]  CBC     Status: Abnormal   Collection Time: 07/16/17  1:55 PM  Result Value Ref Range   WBC 4.5 4.0 - 10.5 K/uL   RBC 4.54 3.87 - 5.11 MIL/uL   Hemoglobin 15.5 (H) 12.0 - 15.0 g/dL   HCT 45.1 36.0 - 46.0 %   MCV 99.3 78.0 - 100.0 fL   MCH 34.1 (H) 26.0 - 34.0 pg   MCHC 34.4 30.0 - 36.0 g/dL   RDW 12.7 11.5 - 15.5 %   Platelets 294 150 - 400 K/uL    Comment: Performed at  [facility], [place] [address], [place], [place] [address]  Urinalysis, Routine w reflex microscopic     Status: Abnormal   Collection Time: 07/16/17  5:26 PM  Result Value Ref Range   Color, Urine YELLOW YELLOW   APPearance HAZY (A) CLEAR   Specific Gravity, Urine >1.046 (H) 1.005 - 1.030   pH 5.0 5.0 - 8.0   Glucose, UA NEGATIVE NEGATIVE mg/dL   Hgb urine dipstick NEGATIVE NEGATIVE   Bilirubin Urine NEGATIVE NEGATIVE   Ketones, ur 20 (A) NEGATIVE mg/dL   Protein, ur NEGATIVE NEGATIVE mg/dL   Nitrite NEGATIVE NEGATIVE   Leukocytes, UA NEGATIVE NEGATIVE    Comment: Performed at [facility] [address] [place], [place] [address]   Ct Abdomen Pelvis W Contrast  Result Date: 07/16/2017 CLINICAL DATA:  Right abdominal pain radiating to the lower abdomen. Nausea. EXAM: CT ABDOMEN AND PELVIS WITH CONTRAST TECHNIQUE: Multidetector CT imaging of the abdomen and pelvis was performed using the standard protocol following bolus administration of intravenous contrast. CONTRAST:  161m ISOVUE-300 IOPAMIDOL (ISOVUE-300) INJECTION 61% COMPARISON:  07/16/2017 abdominal radiographs. FINDINGS: Lower chest: Subpleural 3 mm solid medial left lower lobe pulmonary nodule (series 4/image 32). Hypoventilatory changes at the dependent right lung base. Hepatobiliary: Normal liver size. Two scattered small simple liver cysts, largest 1.4 cm in the posterior right liver lobe. Two subcentimeter hypodense lesions in the right liver lobe are too small to characterize and require no follow-up unless the patient has risk factors for liver malignancy. Normal gallbladder with no radiopaque cholelithiasis. No biliary ductal dilatation. Pancreas: Normal, with no mass or duct dilation. Spleen: Normal size. No mass. Adrenals/Urinary Tract: Normal adrenals. Normal kidneys with no hydronephrosis and no renal mass. Normal bladder. Stomach/Bowel: Small hiatal hernia. Otherwise normal nondistended  stomach. No dilated small bowel loops. There is mild wall thickening within several top-normal caliber small bowel loops. No focal small bowel caliber transition. The appendix is prominently dilated (19 mm diameter). There is a calcified 5 mm appendicolith proximally in the appendix. There is wall thickening and hyperenhancement in the appendix. There is periappendiceal fat stranding. These findings are compatible with acute pancreatitis. The mid appendiceal wall is focally indistinct, cannot exclude appendiceal rupture. No free air. No abscess. Appendix: Location: Right lower quadrant Diameter: 19 mm Appendicolith: Present Mucosal hyper-enhancement: Present Extraluminal gas: Not present Periappendiceal collection: Not present There  is a suggestion of mild diffuse large bowel wall thickening, most of which may be due to underdistention. Vascular/Lymphatic: Normal caliber abdominal aorta. Patent portal, splenic, hepatic and renal veins. No pathologically enlarged lymph nodes in the abdomen or pelvis. Reproductive: Status post hysterectomy, with no abnormal findings at the vaginal cuff. No adnexal mass. Other: No pneumoperitoneum. There is diffuse peritoneal thickening and fat stranding with trace pelvic ascites. No focal fluid collection. Partially visualized intact appearing bilateral breast prostheses. Tiny fat containing umbilical hernia. Musculoskeletal: No aggressive appearing focal osseous lesions. Marked lower lumbar degenerative disc disease. IMPRESSION: 1. Distended thick-walled appendix with appendicolith and periappendiceal inflammatory changes, compatible with acute appendicitis. Mid appendiceal wall is focally indistinct, cannot exclude ruptured appendicitis. No free air. No abscess. 2. Diffuse peritoneal thickening and fat stranding with trace pelvic ascites. Findings are suggestive of peritonitis. 3. Suggestion of mild wall thickening within several pelvic small bowel loops and throughout the large  bowel, nonspecific, favor reactive given the findings of peritonitis. 4. Subpleural 3 mm left lower lobe pulmonary nodule, almost certainly benign. No follow-up needed if patient is low-risk. Non-contrast chest CT can be considered in 12 months if patient is high-risk. This recommendation follows the consensus statement: Guidelines for Management of Incidental Pulmonary Nodules Detected on CT Images:From the Fleischner Society 2017; published online before print (10.1148/radiol.[id number]). 5. Small hiatal hernia. These results were called by telephone at the time of interpretation on 07/16/2017 at 4:42 pm to Dr. Dorie Rank , who verbally acknowledged these results. Electronically Signed   By: Ilona Sorrel M.D.   On: 07/16/2017 16:45   Dg Abd Acute W/chest  Result Date: 07/16/2017 CLINICAL DATA:  67 year old female with a history of lower abdominal pain EXAM: DG ABDOMEN ACUTE W/ 1V CHEST COMPARISON:  None. FINDINGS: Chest: Cardiomediastinal silhouette within normal limits in size and contour. No evidence of central vascular congestion. No interlobular septal thickening. Coarsened interstitial markings, with no comparison. No confluent airspace disease. Abdomen: Gas within stomach, small bowel, colon. No abnormal distention. Air-fluid level within the low abdomen in small bowel loops. No significant stool burden. No unexpected radiopaque foreign body, soft tissue density, or calcification. Left apex curvature of the lumbar spine. No acute displaced fracture IMPRESSION: Chest: Likely chronic changes with no evidence of acute cardiopulmonary disease. Abdomen: Nonspecific bowel gas pattern, with no abnormal distention. Electronically Signed   By: Corrie Mckusick D.O.   On: 07/16/2017 15:25    Review of Systems  Constitutional: Negative for chills, diaphoresis and fever.  HENT: Negative.   Eyes: Negative.   Respiratory: Negative.   Cardiovascular: Negative.   Gastrointestinal: Positive for abdominal pain and  nausea. Negative for constipation and diarrhea.  Genitourinary: Negative.   Musculoskeletal: Negative.   Skin: Negative.   Neurological: Negative.   Endo/Heme/Allergies: Negative.   Psychiatric/Behavioral: Negative.     Blood pressure 130/86, pulse (!) 106, temperature 97.7 F (36.5 C), temperature source Oral, resp. rate 15, height _0  (1.651 m), weight 58.5 kg (129 lb), SpO2 (!) 70 %. Physical Exam  Constitutional: She is oriented to person, place, and time. She appears well-developed and well-nourished. No distress.  HENT:  Head: Normocephalic and atraumatic.  Right Ear: External ear normal.  Left Ear: External ear normal.  Mouth/Throat: No oropharyngeal exudate.  Eyes: Pupils are equal, round, and reactive to light. Conjunctivae are normal. No scleral icterus.  Neck: Normal range of motion. Neck supple. No tracheal deviation present. No thyromegaly present.  Cardiovascular: Normal rate, regular rhythm and normal  heart sounds.  No murmur heard. Respiratory: Effort normal and breath sounds normal. No respiratory distress. She has no wheezes.  GI: Soft. Bowel sounds are normal. She exhibits no distension and no mass. There is tenderness (generalized). There is rebound (mild) and guarding.  Musculoskeletal: Normal range of motion. She exhibits no edema, tenderness or deformity.  Lymphadenopathy:    She has no cervical adenopathy.  Neurological: She is alert and oriented to person, place, and time.  Skin: Skin is warm and dry. She is not diaphoretic.  Psychiatric: She has a normal mood and affect. Her behavior is normal.     Assessment/Plan Acute appendicitis, possible perforation with peritonitis  Admit to general surgery service  IV Zosyn started by ER MD  OR for appendectomy this evening  I discussed the need for laparoscopic appendectomy to be performed this evening.  We discussed the procedure.  We discussed the possibility of perforation in general peritonitis.  We  discussed the possible need for conversion to open surgery.  We discussed the hospital stay to be anticipated as well as her postoperative recovery.  The patient and her daughter understand and agree to proceed with surgery at this time.  The risks and benefits of the procedure have been discussed at length with the patient.  The patient understands the proposed procedure, potential alternative treatments, and the course of recovery to be expected.  All of the patient's questions have been answered at this time.  The patient wishes to proceed with surgery.  Armandina Gemma, [facility] Office: [phone]    Earnstine Regal, MD 07/16/2017, 6:17 PM

## 2017-07-16 NOTE — Op Note (Signed)
OPERATIVE REPORT - LAPAROSCOPIC APPENDECTOMY  Preop diagnosis:  Acute appendicitis  Postop diagnosis:  Perforated acute appendicitis with generalized peritonitis  Procedure:  Laparoscopic appendectomy  Surgeon:  Armandina Gemma, MD  Anesthesia:  general endotracheal  Estimated blood loss:  minimal  Preparation:  Chlora-prep  Complications:  none  Indications:  Patient is a 67 year old female who presents to the emergency department with a 2-day history of abdominal pain.  Pain became more persistent and more sharp in nature.  Patient presented to her primary care physician's office for evaluation.  She was referred to the emergency department for imaging and further evaluation.  Laboratory studies show normal white blood cell count of 4.5.  Patient underwent CT scan abdomen and pelvis showing signs of acute appendicitis with possible perforation and generalized peritonitis.    Procedure:  Patient is brought to the operating room and placed in a supine position on the operating room table. Following administration of general anesthesia, a time out was held and the patient's name and procedure is confirmed. Patient is then prepped and draped in the usual strict aseptic fashion.  After ascertaining that an adequate level of anesthesia has been achieved, a peri-umbilical incision is made with a #15 blade. Dissection is carried down to the fascia. Fascia is incised in the midline and the peritoneal cavity is entered cautiously. A #0-vicryl pursestring suture is placed in the fascia. An Hassan cannula is introduced under direct vision and secured with the pursestring suture. The abdomen is insufflated with carbon dioxide. The laparoscope is introduced and the abdomen is explored. Operative ports are placed in the right upper quadrant and left lower quadrant. There is cloudy fluid present throughout the abdomen.  There are diffuse inflammatory changes present.  The appendix is identified. The  mesoappendix is divided with the harmonic scalpel. Dissection is carried down to the base of the appendix. The base of the appendix is dissected out clearing the junction with the cecal wall. Using an Endo-GIA stapler, the base of the appendix is transected at the junction with the cecal wall. There is good approximation of tissue along the staple line. There is good hemostasis along the staple line. The appendix is placed into an endo-catch bag and withdrawn through the umbilical port.   The abdomen is copiously irrigated with normal saline which is evacuated.  Good hemostasis is noted.  The umbilical port is removed and the #0-vicryl pursestring suture is tied securely.  Ports are removed under direct vision. Good hemostasis is noted at the port sites. Pneumoperitoneum is released.  Skin incisions are anesthetized with local anesthetic. Wounds are closed with interrupted 4-0 Monocryl subcuticular sutures. Wounds are washed and dried and Steri-Strips are applied. Dressings are applied. The patient is awakened from anesthesia and brought to the recovery room. The patient tolerated the procedure well.  Armandina Gemma, MD [facility] Office: [phone]

## 2017-07-16 NOTE — ED Provider Notes (Signed)
[facility] DEPT Provider Note   CSN: [id number] Arrival date & time: 07/16/17  1204     History   Chief Complaint Chief Complaint  Patient presents with  . Abdominal Pain    HPI Kristin Gentry is a 67 y.o. female.  HPI Patient presents to the emergency room for evaluation of abdominal pain.  Patient started with sharp pain in her upper and lower abdomen on the right side last evening.  She said some nausea but no vomiting or diarrhea.  Patient feels like her bladder might be full but she has not been able to urinate normally since this morning.  Pain increases with movement and palpation.  Patient has had prior abdominal hysterectomy but otherwise no other abdominal surgeries.  Patient symptoms have been increasing intensity and now are severe. Past Medical History:  Diagnosis Date  . Abnormal Pap smear of cervix 1990  . Arthritis    left knee  . 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 Cancer ([place]) 1989   cervical  . GERD (gastroesophageal reflux disease)   . Hypothyroidism   . 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 Thyroid disease    hypothyroid    There are no active problems to display for this patient.   Past Surgical History:  Procedure Laterality Date  . AUGMENTATION MAMMAPLASTY  07/2005   --saline  . CESAREAN SECTION  1986  . COLONOSCOPY WITH PROPOFOL N/A 11/12/2013   Procedure: COLONOSCOPY WITH PROPOFOL;  Surgeon: Garlan Fair, MD;  Location: [facility];  Service: Endoscopy;  Laterality: N/A;  . [place]   of cervix  . Total Vaginal Hysterectomy/BSO  2001   --fibroids     OB History    Gravida  4   Para  2   Term  2   Preterm      AB  2   Living  2     SAB  2   TAB      Ectopic      Multiple      Live Births               Home Medications    Prior to Admission medications   Medication Sig Start Date End Date Taking? Authorizing Provider  acetaminophen (TYLENOL) 500 MG tablet  Take 1,000 mg by mouth every 6 (six) hours as needed for headache.   Yes [provider]  Biotin 10 MG CAPS Take 1 capsule by mouth every morning.    Yes [provider]  calcium carbonate (OS-CAL) 600 MG TABS tablet Take 600 mg by mouth 2 (two) times daily with a meal.   Yes [provider]  cholecalciferol (VITAMIN D) 1000 UNITS tablet Take 1,000 Units by mouth every morning.    Yes [provider]  levothyroxine (SYNTHROID, LEVOTHROID) 88 MCG tablet Take 88 mcg by mouth daily before breakfast.   Yes [provider]  Polyethyl Glycol-Propyl Glycol (SYSTANE) 0.4-0.3 % SOLN Place 1 drop into both eyes daily.   Yes [provider]  risedronate (ACTONEL) 150 MG tablet Take 150 mg by mouth every 30 (thirty) days. 06/11/17  Yes [provider]    Family History Family History  Problem Relation Age of Onset  . Hypertension Mother   . Heart disease Mother   . Thyroid disease Mother        hypothyroid  . Diabetes Father   . Hypertension Father   . Heart disease  Father   . Thyroid disease Sister        hypothyroid    Social History Social History   Tobacco Use  . Smoking status: Former Smoker    Packs/day: 0.50    Years: 10.00    Pack years: 5.00    Types: Cigarettes    Last attempt to quit: 02/27/2003    Years since quitting: 14.3  . Smokeless tobacco: Never Used  Substance Use Topics  . Alcohol use: Yes    Alcohol/week: 1.0 oz    Types: 2 drink(s) per week    Comment: socially  . Drug use: No     Allergies   Patient has no known allergies.   Review of Systems Review of Systems  Constitutional: Negative for fever.  Respiratory: Negative for shortness of breath.   Cardiovascular: Negative for chest pain.  Musculoskeletal: Negative for back pain.  All other systems reviewed and are negative.    Physical Exam Updated Vital Signs BP 136/82 (BP Location: Right Arm)   Pulse (!) 114   Temp 97.7 F (36.5 C)  (Oral)   Resp 15   Ht 1.651 m (5\' 5" )   Wt 58.5 kg (129 lb)   SpO2 100%   BMI 21.47 kg/m   Physical Exam  Constitutional: She appears ill. No distress.  HENT:  Head: Normocephalic and atraumatic.  Right Ear: External ear normal.  Left Ear: External ear normal.  Eyes: Conjunctivae are normal. Right eye exhibits no discharge. Left eye exhibits no discharge. No scleral icterus.  Neck: Neck supple. No tracheal deviation present.  Cardiovascular: Normal rate, regular rhythm and intact distal pulses.  Pulmonary/Chest: Effort normal and breath sounds normal. No stridor. No respiratory distress. She has no wheezes. She has no rales.  Abdominal: Soft. Bowel sounds are normal. She exhibits no distension. There is generalized tenderness. There is no rigidity, no rebound and no guarding. No hernia.  Musculoskeletal: She exhibits no edema or tenderness.  Neurological: She is alert. She has normal strength. No cranial nerve deficit (no facial droop, extraocular movements intact, no slurred speech) or sensory deficit. She exhibits normal muscle tone. She displays no seizure activity. Coordination normal.  Skin: Skin is warm and dry. No rash noted.  Psychiatric: She has a normal mood and affect.  Nursing note and vitals reviewed.    ED Treatments / Results  Labs (all labs ordered are listed, but only abnormal results are displayed) Labs Reviewed  COMPREHENSIVE METABOLIC PANEL - Abnormal; Notable for the following components:      Result Value   Chloride 98 (*)    CO2 19 (*)    Glucose, Bld 108 (*)    Creatinine, Ser 1.11 (*)    AST 43 (*)    ALT 64 (*)    Total Bilirubin 1.9 (*)    GFR calc non Af Amer 51 (*)    GFR calc Af Amer 59 (*)    Anion gap 19 (*)    All other components within normal limits  CBC - Abnormal; Notable for the following components:   Hemoglobin 15.5 (*)    MCH 34.1 (*)    All other components within normal limits  LIPASE, BLOOD  URINALYSIS, ROUTINE W REFLEX  MICROSCOPIC    EKG None  Radiology Ct Abdomen Pelvis W Contrast  Result Date: 07/16/2017 CLINICAL DATA:  Right abdominal pain radiating to the lower abdomen. Nausea. EXAM: CT ABDOMEN AND PELVIS WITH CONTRAST TECHNIQUE: Multidetector CT imaging of the abdomen and pelvis  was performed using the standard protocol following bolus administration of intravenous contrast. CONTRAST:  119mL ISOVUE-300 IOPAMIDOL (ISOVUE-300) INJECTION 61% COMPARISON:  07/16/2017 abdominal radiographs. FINDINGS: Lower chest: Subpleural 3 mm solid medial left lower lobe pulmonary nodule (series 4/image 32). Hypoventilatory changes at the dependent right lung base. Hepatobiliary: Normal liver size. Two scattered small simple liver cysts, largest 1.4 cm in the posterior right liver lobe. Two subcentimeter hypodense lesions in the right liver lobe are too small to characterize and require no follow-up unless the patient has risk factors for liver malignancy. Normal gallbladder with no radiopaque cholelithiasis. No biliary ductal dilatation. Pancreas: Normal, with no mass or duct dilation. Spleen: Normal size. No mass. Adrenals/Urinary Tract: Normal adrenals. Normal kidneys with no hydronephrosis and no renal mass. Normal bladder. Stomach/Bowel: Small hiatal hernia. Otherwise normal nondistended stomach. No dilated small bowel loops. There is mild wall thickening within several top-normal caliber small bowel loops. No focal small bowel caliber transition. The appendix is prominently dilated (19 mm diameter). There is a calcified 5 mm appendicolith proximally in the appendix. There is wall thickening and hyperenhancement in the appendix. There is periappendiceal fat stranding. These findings are compatible with acute pancreatitis. The mid appendiceal wall is focally indistinct, cannot exclude appendiceal rupture. No free air. No abscess. Appendix: Location: Right lower quadrant Diameter: 19 mm Appendicolith: Present Mucosal  hyper-enhancement: Present Extraluminal gas: Not present Periappendiceal collection: Not present There is a suggestion of mild diffuse large bowel wall thickening, most of which may be due to underdistention. Vascular/Lymphatic: Normal caliber abdominal aorta. Patent portal, splenic, hepatic and renal veins. No pathologically enlarged lymph nodes in the abdomen or pelvis. Reproductive: Status post hysterectomy, with no abnormal findings at the vaginal cuff. No adnexal mass. Other: No pneumoperitoneum. There is diffuse peritoneal thickening and fat stranding with trace pelvic ascites. No focal fluid collection. Partially visualized intact appearing bilateral breast prostheses. Tiny fat containing umbilical hernia. Musculoskeletal: No aggressive appearing focal osseous lesions. Marked lower lumbar degenerative disc disease. IMPRESSION: 1. Distended thick-walled appendix with appendicolith and periappendiceal inflammatory changes, compatible with acute appendicitis. Mid appendiceal wall is focally indistinct, cannot exclude ruptured appendicitis. No free air. No abscess. 2. Diffuse peritoneal thickening and fat stranding with trace pelvic ascites. Findings are suggestive of peritonitis. 3. Suggestion of mild wall thickening within several pelvic small bowel loops and throughout the large bowel, nonspecific, favor reactive given the findings of peritonitis. 4. Subpleural 3 mm left lower lobe pulmonary nodule, almost certainly benign. No follow-up needed if patient is low-risk. Non-contrast chest CT can be considered in 12 months if patient is high-risk. This recommendation follows the consensus statement: Guidelines for Management of Incidental Pulmonary Nodules Detected on CT Images:From the Fleischner Society 2017; published online before print (10.1148/[id number]). 5. Small hiatal hernia. These results were called by telephone at the time of interpretation on 07/16/2017 at 4:42 pm to Dr. Dorie Rank , who  verbally acknowledged these results. Electronically Signed   By: Ilona Sorrel M.D.   On: 07/16/2017 16:45   Dg Abd Acute W/chest  Result Date: 07/16/2017 CLINICAL DATA:  67 year old female with a history of lower abdominal pain EXAM: DG ABDOMEN ACUTE W/ 1V CHEST COMPARISON:  None. FINDINGS: Chest: Cardiomediastinal silhouette within normal limits in size and contour. No evidence of central vascular congestion. No interlobular septal thickening. Coarsened interstitial markings, with no comparison. No confluent airspace disease. Abdomen: Gas within stomach, small bowel, colon. No abnormal distention. Air-fluid level within the low abdomen in small bowel loops. No  significant stool burden. No unexpected radiopaque foreign body, soft tissue density, or calcification. Left apex curvature of the lumbar spine. No acute displaced fracture IMPRESSION: Chest: Likely chronic changes with no evidence of acute cardiopulmonary disease. Abdomen: Nonspecific bowel gas pattern, with no abnormal distention. Electronically Signed   By: Corrie Mckusick D.O.   On: 07/16/2017 15:25    Procedures Procedures (including critical care time)  Medications Ordered in ED Medications  sodium chloride 0.9 % bolus 1,000 mL (0 mLs Intravenous Stopped 07/16/17 1540)    And  0.9 %  sodium chloride infusion (125 mL/hr Intravenous New Bag/Given 07/16/17 1630)  HYDROmorphone (DILAUDID) injection 0.5 mg (0.5 mg Intravenous Given 07/16/17 1414)  piperacillin-tazobactam (ZOSYN) IVPB 3.375 g (has no administration in time range)  ondansetron (ZOFRAN) injection 4 mg (4 mg Intravenous Given 07/16/17 1415)  iopamidol (ISOVUE-300) 61 % injection (  Contrast Given 07/16/17 1630)  iopamidol (ISOVUE-300) 61 % injection 100 mL (100 mLs Intravenous Contrast Given 07/16/17 1612)     Initial Impression / Assessment and Plan / ED Course  I have reviewed the triage vital signs and the nursing notes.  Pertinent labs & imaging results that were available  during my care of the patient were reviewed by me and considered in my medical decision making (see chart for details).   Patient presented to the emergency room with acute abdominal pain.  Laboratory tests were unremarkable.  CT scan demonstrates findings suggestive of appendicitis with peritonitis.  No signs of abscess.  Plan on IV antibiotics.  I will consult with surgery for admission.  Final Clinical Impressions(s) / ED Diagnoses   Final diagnoses:  Acute appendicitis with peritonitis    ED Discharge Orders    None       Dorie Rank, MD 07/16/17 1705

## 2017-07-16 NOTE — ED Triage Notes (Signed)
Pt reports sharp sudden and constant RUQ and RLQ abd pain starting last night. Reports nausea, no V/D. LBM Saturday. Pt also reports dizziness starting today. Pt also reports feeling as though bladder is full. Last urinated this morning. Was seen at PCP, the referred pt to go to ED for further evaluation.

## 2017-07-16 NOTE — ED Notes (Signed)
Coming from PCP-states RLQ pain

## 2017-07-16 NOTE — ED Notes (Signed)
No OR orders have been placed at this time.

## 2017-07-16 NOTE — Anesthesia Procedure Notes (Signed)
Procedure Name: Intubation Date/Time: 07/16/2017 7:02 PM Performed by: Anne Fu, CRNA Pre-anesthesia Checklist: Patient identified, Emergency Drugs available, Suction available, Patient being monitored and Timeout performed Patient Re-evaluated:Patient Re-evaluated prior to induction Oxygen Delivery Method: Circle system utilized Preoxygenation: Pre-oxygenation with 100% oxygen Induction Type: IV induction, Cricoid Pressure applied and Rapid sequence Laryngoscope Size: Mac and 4 Grade View: Grade I Tube type: Oral Tube size: 7.5 mm Number of attempts: 1 Airway Equipment and Method: Stylet Placement Confirmation: ETT inserted through vocal cords under direct vision,  positive ETCO2 and breath sounds checked- equal and bilateral Secured at: 21 cm Tube secured with: Tape Dental Injury: Teeth and Oropharynx as per pre-operative assessment

## 2017-07-16 NOTE — ED Notes (Signed)
Pt bladder scanned 20-39ml  Are noted

## 2017-07-16 NOTE — ED Notes (Signed)
Surgery called requesting pt.

## 2017-07-16 NOTE — Transfer of Care (Signed)
Immediate Anesthesia Transfer of Care Note  Patient: [place] OVERALL  Procedure(s) Performed: Procedure(s): APPENDECTOMY LAPAROSCOPIC (N/A)  Patient Location: PACU  Anesthesia Type:General  Level of Consciousness:  sedated, patient cooperative and responds to stimulation  Airway & Oxygen Therapy:Patient Spontanous Breathing and Patient connected to face mask oxgen  Post-op Assessment:  Report given to PACU RN and Post -op Vital signs reviewed and stable  Post vital signs:  Reviewed and stable  Last Vitals:  Vitals:   07/16/17 1833 07/16/17 2008  BP: (!) 143/80 132/74  Pulse: (!) 119 (!) 105  Resp: 16 19  Temp: 37.1 C 37.3 C  SpO2:  97%    Complications: No apparent anesthesia complications

## 2017-07-16 NOTE — ED Notes (Signed)
Pt is alert and oriented x 4 and is verbally responsive. Pt reports that she has Rt side abdominal pain that radiates to lower abdominal . Pt does reports nausea. Pt denies vomiting and diarrhea. Pt reports that she was seen at PCP this am and was referred to the ED,

## 2017-07-17 ENCOUNTER — Encounter (HOSPITAL_COMMUNITY): Payer: Self-pay | Admitting: Surgery

## 2017-07-17 NOTE — Plan of Care (Signed)
Pt alert and oriented, states she is feeling better today.  Ambulating in halls, voiding, pain well controlled with PO pain meds.  Clear liquid diet, will be advanced to soft diet tomorrow morning. RN will monitor.

## 2017-07-17 NOTE — Progress Notes (Signed)
Patient ID: Kristin Gentry, female   DOB: 06/06/1950, 67 y.o.   MRN: [id number]    1 Day Post-Op  Subjective: Patient feels great today.  Having flatus.  Tolerating clear liquids.  Objective: Vital signs in last 24 hours: Temp:  [97.6 F (36.4 C)-99.2 F (37.3 C)] 98.3 F (36.8 C) (05/22 0546) Pulse Rate:  [80-120] 80 (05/22 0546) Resp:  [13-22] 16 (05/22 0546) BP: (100-143)/(67-86) 100/67 (05/22 0546) SpO2:  [70 %-100 %] 97 % (05/22 0546) Weight:  [58.5 kg (129 lb)] 58.5 kg (129 lb) (05/21 1228) Last BM Date: 07/14/17  Intake/Output from previous day: 05/21 0701 - 05/22 0700 In: 2241.3 [P.O.:490; I.V.:651.3; IV Piggyback:1100] Out: 1410 [Urine:1400; Blood:10] Intake/Output this shift: No intake/output data recorded.  PE: Abd: soft, appropriately tender, +BS, ND, incisions c/d/i with gauze and tegaderm. Heart: regular  Lab Results:  Recent Labs    07/16/17 1355  WBC 4.5  HGB 15.5*  HCT 45.1  PLT 294   BMET Recent Labs    07/16/17 1355  NA 136  K 4.1  CL 98*  CO2 19*  GLUCOSE 108*  BUN 14  CREATININE 1.11*  CALCIUM 9.4   PT/INR No results for input(s): LABPROT, INR in the last 72 hours. CMP     Component Value Date/Time   NA 136 07/16/2017 1355   K 4.1 07/16/2017 1355   CL 98 (L) 07/16/2017 1355   CO2 19 (L) 07/16/2017 1355   GLUCOSE 108 (H) 07/16/2017 1355   BUN 14 07/16/2017 1355   CREATININE 1.11 (H) 07/16/2017 1355   CALCIUM 9.4 07/16/2017 1355   PROT 7.6 07/16/2017 1355   ALBUMIN 4.1 07/16/2017 1355   AST 43 (H) 07/16/2017 1355   ALT 64 (H) 07/16/2017 1355   ALKPHOS 61 07/16/2017 1355   BILITOT 1.9 (H) 07/16/2017 1355   GFRNONAA 51 (L) 07/16/2017 1355   GFRAA 59 (L) 07/16/2017 1355   Lipase     Component Value Date/Time   LIPASE 20 07/16/2017 1355       Studies/Results: Ct Abdomen Pelvis W Contrast  Result Date: 07/16/2017 CLINICAL DATA:  Right abdominal pain radiating to the lower abdomen. Nausea. EXAM: CT ABDOMEN AND  PELVIS WITH CONTRAST TECHNIQUE: Multidetector CT imaging of the abdomen and pelvis was performed using the standard protocol following bolus administration of intravenous contrast. CONTRAST:  187mL ISOVUE-300 IOPAMIDOL (ISOVUE-300) INJECTION 61% COMPARISON:  07/16/2017 abdominal radiographs. FINDINGS: Lower chest: Subpleural 3 mm solid medial left lower lobe pulmonary nodule (series 4/image 32). Hypoventilatory changes at the dependent right lung base. Hepatobiliary: Normal liver size. Two scattered small simple liver cysts, largest 1.4 cm in the posterior right liver lobe. Two subcentimeter hypodense lesions in the right liver lobe are too small to characterize and require no follow-up unless the patient has risk factors for liver malignancy. Normal gallbladder with no radiopaque cholelithiasis. No biliary ductal dilatation. Pancreas: Normal, with no mass or duct dilation. Spleen: Normal size. No mass. Adrenals/Urinary Tract: Normal adrenals. Normal kidneys with no hydronephrosis and no renal mass. Normal bladder. Stomach/Bowel: Small hiatal hernia. Otherwise normal nondistended stomach. No dilated small bowel loops. There is mild wall thickening within several top-normal caliber small bowel loops. No focal small bowel caliber transition. The appendix is prominently dilated (19 mm diameter). There is a calcified 5 mm appendicolith proximally in the appendix. There is wall thickening and hyperenhancement in the appendix. There is periappendiceal fat stranding. These findings are compatible with acute pancreatitis. The mid appendiceal wall is focally indistinct,  cannot exclude appendiceal rupture. No free air. No abscess. Appendix: Location: Right lower quadrant Diameter: 19 mm Appendicolith: Present Mucosal hyper-enhancement: Present Extraluminal gas: Not present Periappendiceal collection: Not present There is a suggestion of mild diffuse large bowel wall thickening, most of which may be due to underdistention.  Vascular/Lymphatic: Normal caliber abdominal aorta. Patent portal, splenic, hepatic and renal veins. No pathologically enlarged lymph nodes in the abdomen or pelvis. Reproductive: Status post hysterectomy, with no abnormal findings at the vaginal cuff. No adnexal mass. Other: No pneumoperitoneum. There is diffuse peritoneal thickening and fat stranding with trace pelvic ascites. No focal fluid collection. Partially visualized intact appearing bilateral breast prostheses. Tiny fat containing umbilical hernia. Musculoskeletal: No aggressive appearing focal osseous lesions. Marked lower lumbar degenerative disc disease. IMPRESSION: 1. Distended thick-walled appendix with appendicolith and periappendiceal inflammatory changes, compatible with acute appendicitis. Mid appendiceal wall is focally indistinct, cannot exclude ruptured appendicitis. No free air. No abscess. 2. Diffuse peritoneal thickening and fat stranding with trace pelvic ascites. Findings are suggestive of peritonitis. 3. Suggestion of mild wall thickening within several pelvic small bowel loops and throughout the large bowel, nonspecific, favor reactive given the findings of peritonitis. 4. Subpleural 3 mm left lower lobe pulmonary nodule, almost certainly benign. No follow-up needed if patient is low-risk. Non-contrast chest CT can be considered in 12 months if patient is high-risk. This recommendation follows the consensus statement: Guidelines for Management of Incidental Pulmonary Nodules Detected on CT Images:From the Fleischner Society 2017; published online before print (10.1148/[id number]). 5. Small hiatal hernia. These results were called by telephone at the time of interpretation on 07/16/2017 at 4:42 pm to Dr. Dorie Rank , who verbally acknowledged these results. Electronically Signed   By: Ilona Sorrel M.D.   On: 07/16/2017 16:45   Dg Abd Acute W/chest  Result Date: 07/16/2017 CLINICAL DATA:  67 year old female with a history of lower  abdominal pain EXAM: DG ABDOMEN ACUTE W/ 1V CHEST COMPARISON:  None. FINDINGS: Chest: Cardiomediastinal silhouette within normal limits in size and contour. No evidence of central vascular congestion. No interlobular septal thickening. Coarsened interstitial markings, with no comparison. No confluent airspace disease. Abdomen: Gas within stomach, small bowel, colon. No abnormal distention. Air-fluid level within the low abdomen in small bowel loops. No significant stool burden. No unexpected radiopaque foreign body, soft tissue density, or calcification. Left apex curvature of the lumbar spine. No acute displaced fracture IMPRESSION: Chest: Likely chronic changes with no evidence of acute cardiopulmonary disease. Abdomen: Nonspecific bowel gas pattern, with no abnormal distention. Electronically Signed   By: Corrie Mckusick D.O.   On: 07/16/2017 15:25    Anti-infectives: Anti-infectives (From admission, onward)   Start     Dose/Rate Route Frequency Ordered Stop   07/16/17 2300  piperacillin-tazobactam (ZOSYN) IVPB 3.375 g     3.375 g 12.5 mL/hr over 240 Minutes Intravenous Every 8 hours 07/16/17 2104     07/16/17 1715  piperacillin-tazobactam (ZOSYN) IVPB 3.375 g     3.375 g 100 mL/hr over 30 Minutes Intravenous  Once 07/16/17 1702 07/16/17 1752       Assessment/Plan  perforated appendicitis, POD 1, s/p lap appy  -on clear liquids today.  Adv to soft diet in am -mobilize and pulm toilet -cont zosyn while here.  Total of 7 days of abx therapy.  FEN - clears/soft in am VTE - Lovenox/SCDs ID - [email or web address]   LOS: 0 days    Henreitta Cea , [facility] Surgery 07/17/2017, 8:12 AM Pager:  [phone]  

## 2017-07-18 ENCOUNTER — Encounter (HOSPITAL_COMMUNITY): Payer: Self-pay | Admitting: Surgery

## 2017-07-18 NOTE — Progress Notes (Signed)
Patient ID: Kristin Gentry, female   DOB: 22-Sep-1950, 67 y.o.   MRN: [id number]    2 Days Post-Op  Subjective: Patient feels ok this morning, but not as good as yesterday.  She isn't passing flatus yet and feels like she doesn't have much appetite, but isn't nauseated.  Objective: Vital signs in last 24 hours: Temp:  [97.7 F (36.5 C)-98.4 F (36.9 C)] 97.7 F (36.5 C) (05/23 0543) Pulse Rate:  [82-91] 82 (05/23 0543) Resp:  [15-16] 15 (05/23 0543) BP: (127-148)/(75-90) 143/83 (05/23 0543) SpO2:  [98 %-100 %] 100 % (05/23 0543) Last BM Date: 07/14/17  Intake/Output from previous day: 05/22 0701 - 05/23 0700 In: 1318.8 [I.V.:1168.8; IV Piggyback:150] Out: 1550 [Urine:1550] Intake/Output this shift: No intake/output data recorded.  PE: Abd, soft, hypoactive BS, appropriately tender, incisions c/d/i with gauze and tegaderm present  Lab Results:  Recent Labs    07/16/17 1355  WBC 4.5  HGB 15.5*  HCT 45.1  PLT 294   BMET Recent Labs    07/16/17 1355  NA 136  K 4.1  CL 98*  CO2 19*  GLUCOSE 108*  BUN 14  CREATININE 1.11*  CALCIUM 9.4   PT/INR No results for input(s): LABPROT, INR in the last 72 hours. CMP     Component Value Date/Time   NA 136 07/16/2017 1355   K 4.1 07/16/2017 1355   CL 98 (L) 07/16/2017 1355   CO2 19 (L) 07/16/2017 1355   GLUCOSE 108 (H) 07/16/2017 1355   BUN 14 07/16/2017 1355   CREATININE 1.11 (H) 07/16/2017 1355   CALCIUM 9.4 07/16/2017 1355   PROT 7.6 07/16/2017 1355   ALBUMIN 4.1 07/16/2017 1355   AST 43 (H) 07/16/2017 1355   ALT 64 (H) 07/16/2017 1355   ALKPHOS 61 07/16/2017 1355   BILITOT 1.9 (H) 07/16/2017 1355   GFRNONAA 51 (L) 07/16/2017 1355   GFRAA 59 (L) 07/16/2017 1355   Lipase     Component Value Date/Time   LIPASE 20 07/16/2017 1355       Studies/Results: Ct Abdomen Pelvis W Contrast  Result Date: 07/16/2017 CLINICAL DATA:  Right abdominal pain radiating to the lower abdomen. Nausea. EXAM: CT  ABDOMEN AND PELVIS WITH CONTRAST TECHNIQUE: Multidetector CT imaging of the abdomen and pelvis was performed using the standard protocol following bolus administration of intravenous contrast. CONTRAST:  130mL ISOVUE-300 IOPAMIDOL (ISOVUE-300) INJECTION 61% COMPARISON:  07/16/2017 abdominal radiographs. FINDINGS: Lower chest: Subpleural 3 mm solid medial left lower lobe pulmonary nodule (series 4/image 32). Hypoventilatory changes at the dependent right lung base. Hepatobiliary: Normal liver size. Two scattered small simple liver cysts, largest 1.4 cm in the posterior right liver lobe. Two subcentimeter hypodense lesions in the right liver lobe are too small to characterize and require no follow-up unless the patient has risk factors for liver malignancy. Normal gallbladder with no radiopaque cholelithiasis. No biliary ductal dilatation. Pancreas: Normal, with no mass or duct dilation. Spleen: Normal size. No mass. Adrenals/Urinary Tract: Normal adrenals. Normal kidneys with no hydronephrosis and no renal mass. Normal bladder. Stomach/Bowel: Small hiatal hernia. Otherwise normal nondistended stomach. No dilated small bowel loops. There is mild wall thickening within several top-normal caliber small bowel loops. No focal small bowel caliber transition. The appendix is prominently dilated (19 mm diameter). There is a calcified 5 mm appendicolith proximally in the appendix. There is wall thickening and hyperenhancement in the appendix. There is periappendiceal fat stranding. These findings are compatible with acute pancreatitis. The mid appendiceal wall is  focally indistinct, cannot exclude appendiceal rupture. No free air. No abscess. Appendix: Location: Right lower quadrant Diameter: 19 mm Appendicolith: Present Mucosal hyper-enhancement: Present Extraluminal gas: Not present Periappendiceal collection: Not present There is a suggestion of mild diffuse large bowel wall thickening, most of which may be due to  underdistention. Vascular/Lymphatic: Normal caliber abdominal aorta. Patent portal, splenic, hepatic and renal veins. No pathologically enlarged lymph nodes in the abdomen or pelvis. Reproductive: Status post hysterectomy, with no abnormal findings at the vaginal cuff. No adnexal mass. Other: No pneumoperitoneum. There is diffuse peritoneal thickening and fat stranding with trace pelvic ascites. No focal fluid collection. Partially visualized intact appearing bilateral breast prostheses. Tiny fat containing umbilical hernia. Musculoskeletal: No aggressive appearing focal osseous lesions. Marked lower lumbar degenerative disc disease. IMPRESSION: 1. Distended thick-walled appendix with appendicolith and periappendiceal inflammatory changes, compatible with acute appendicitis. Mid appendiceal wall is focally indistinct, cannot exclude ruptured appendicitis. No free air. No abscess. 2. Diffuse peritoneal thickening and fat stranding with trace pelvic ascites. Findings are suggestive of peritonitis. 3. Suggestion of mild wall thickening within several pelvic small bowel loops and throughout the large bowel, nonspecific, favor reactive given the findings of peritonitis. 4. Subpleural 3 mm left lower lobe pulmonary nodule, almost certainly benign. No follow-up needed if patient is low-risk. Non-contrast chest CT can be considered in 12 months if patient is high-risk. This recommendation follows the consensus statement: Guidelines for Management of Incidental Pulmonary Nodules Detected on CT Images:From the Fleischner Society 2017; published online before print (10.1148/radiol.6269485462). 5. Small hiatal hernia. These results were called by telephone at the time of interpretation on 07/16/2017 at 4:42 pm to Dr. Dorie Rank , who verbally acknowledged these results. Electronically Signed   By: Ilona Sorrel M.D.   On: 07/16/2017 16:45   Dg Abd Acute W/chest  Result Date: 07/16/2017 CLINICAL DATA:  67 year old female with a  history of lower abdominal pain EXAM: DG ABDOMEN ACUTE W/ 1V CHEST COMPARISON:  None. FINDINGS: Chest: Cardiomediastinal silhouette within normal limits in size and contour. No evidence of central vascular congestion. No interlobular septal thickening. Coarsened interstitial markings, with no comparison. No confluent airspace disease. Abdomen: Gas within stomach, small bowel, colon. No abnormal distention. Air-fluid level within the low abdomen in small bowel loops. No significant stool burden. No unexpected radiopaque foreign body, soft tissue density, or calcification. Left apex curvature of the lumbar spine. No acute displaced fracture IMPRESSION: Chest: Likely chronic changes with no evidence of acute cardiopulmonary disease. Abdomen: Nonspecific bowel gas pattern, with no abnormal distention. Electronically Signed   By: Corrie Mckusick D.O.   On: 07/16/2017 15:25    Anti-infectives: Anti-infectives (From admission, onward)   Start     Dose/Rate Route Frequency Ordered Stop   07/16/17 2300  piperacillin-tazobactam (ZOSYN) IVPB 3.375 g     3.375 g 12.5 mL/hr over 240 Minutes Intravenous Every 8 hours 07/16/17 2104     07/16/17 1715  piperacillin-tazobactam (ZOSYN) IVPB 3.375 g     3.375 g 100 mL/hr over 30 Minutes Intravenous  Once 07/16/17 1702 07/16/17 1752       Assessment/Plan perforated appendicitis, POD 2, s/p lap appy  -on clear liquids today.  Adv to fulls, but don't push it.  She has somewhat of a post op ileus -mobilize and pulm toilet -cont zosyn while here.  Total of 7 days of abx therapy.  FEN - fulls, but if makes nauseated will need to return to clears or NPO. VTE - Lovenox/SCDs ID -  zosyn    LOS: 0 days    Henreitta Cea , [facility] Surgery 07/18/2017, 8:27 AM Pager: [phone]

## 2017-07-18 NOTE — Plan of Care (Signed)
Pt doing well this am. Minimal complaints of pain, states not passing gas yet, but can feel it.  Ambulating in room and hall, advanced to soft diet this am. RN will monitor.

## 2017-07-18 NOTE — Anesthesia Postprocedure Evaluation (Signed)
Anesthesia Post Note  Patient: Kristin Gentry  Procedure(s) Performed: APPENDECTOMY LAPAROSCOPIC (N/A Abdomen)     Patient location during evaluation: PACU Anesthesia Type: General Level of consciousness: awake and alert Pain management: pain level controlled Vital Signs Assessment: post-procedure vital signs reviewed and stable Respiratory status: spontaneous breathing, nonlabored ventilation, respiratory function stable and patient connected to nasal cannula oxygen Cardiovascular status: blood pressure returned to baseline and stable Postop Assessment: no apparent nausea or vomiting Anesthetic complications: no    Last Vitals:  Vitals:   07/17/17 2005 07/18/17 0543  BP: (!) 148/90 (!) 143/83  Pulse: 89 82  Resp: 16 15  Temp: 36.9 C 36.5 C  SpO2: 100% 100%    Last Pain:  Vitals:   07/18/17 0735  TempSrc:   PainSc: 2                  [name] DAVID

## 2017-07-19 DIAGNOSIS — K661 Hemoperitoneum: Secondary | ICD-10-CM | POA: Diagnosis not present

## 2017-07-19 DIAGNOSIS — K567 Ileus, unspecified: Secondary | ICD-10-CM | POA: Diagnosis not present

## 2017-07-19 DIAGNOSIS — E039 Hypothyroidism, unspecified: Secondary | ICD-10-CM | POA: Diagnosis not present

## 2017-07-19 DIAGNOSIS — D649 Anemia, unspecified: Secondary | ICD-10-CM | POA: Diagnosis not present

## 2017-07-19 DIAGNOSIS — R55 Syncope and collapse: Secondary | ICD-10-CM | POA: Diagnosis not present

## 2017-07-19 DIAGNOSIS — Z8541 Personal history of malignant neoplasm of cervix uteri: Secondary | ICD-10-CM | POA: Diagnosis not present

## 2017-07-19 DIAGNOSIS — K219 Gastro-esophageal reflux disease without esophagitis: Secondary | ICD-10-CM | POA: Diagnosis not present

## 2017-07-19 DIAGNOSIS — K3533 Acute appendicitis with perforation and localized peritonitis, with abscess: Secondary | ICD-10-CM | POA: Diagnosis present

## 2017-07-19 DIAGNOSIS — K352 Acute appendicitis with generalized peritonitis, without abscess: Secondary | ICD-10-CM | POA: Diagnosis not present

## 2017-07-19 DIAGNOSIS — Z79890 Hormone replacement therapy (postmenopausal): Secondary | ICD-10-CM | POA: Diagnosis not present

## 2017-07-19 DIAGNOSIS — Z833 Family history of diabetes mellitus: Secondary | ICD-10-CM | POA: Diagnosis not present

## 2017-07-19 DIAGNOSIS — H5462 Unqualified visual loss, left eye, normal vision right eye: Secondary | ICD-10-CM | POA: Diagnosis not present

## 2017-07-19 DIAGNOSIS — Z8249 Family history of ischemic heart disease and other diseases of the circulatory system: Secondary | ICD-10-CM | POA: Diagnosis not present

## 2017-07-19 DIAGNOSIS — Z87891 Personal history of nicotine dependence: Secondary | ICD-10-CM | POA: Diagnosis not present

## 2017-07-19 DIAGNOSIS — Z90710 Acquired absence of both cervix and uterus: Secondary | ICD-10-CM | POA: Diagnosis not present

## 2017-07-19 DIAGNOSIS — Z8349 Family history of other endocrine, nutritional and metabolic diseases: Secondary | ICD-10-CM | POA: Diagnosis not present

## 2017-07-19 DIAGNOSIS — M1712 Unilateral primary osteoarthritis, left knee: Secondary | ICD-10-CM | POA: Diagnosis present

## 2017-07-19 DIAGNOSIS — R9431 Abnormal electrocardiogram [ECG] [EKG]: Secondary | ICD-10-CM | POA: Diagnosis not present

## 2017-07-19 MED ORDER — AMOXICILLIN-POT CLAVULANATE 875-125 MG PO TABS
1.0000 | ORAL_TABLET | Freq: Two times a day (BID) | ORAL | Status: DC
  Administered 2017-07-19 – 2017-07-20 (×2): 1 via ORAL
  Filled 2017-07-19 (×2): qty 1

## 2017-07-19 NOTE — Discharge Summary (Addendum)
     Patient ID: Kristin Gentry [id number] Jun 17, 1950 67 y.o.  Admit date: 07/16/2017 Discharge date: 07/20/2017  Admitting Diagnosis: Acute appendicitis  Discharge Diagnosis Patient Active Problem List   Diagnosis Date Noted  . Appendicitis, acute perforated 07/16/2017  . Acute perforated appendicitis 07/16/2017    Consultants None  Reason for Admission: Patient is a 67 year old female who presents to the emergency department with a 2-day history of abdominal pain.  Pain became more persistent and more sharp in nature.  Patient presented to her primary care physician's office for evaluation.  She was referred to the emergency department for imaging and further evaluation.  Laboratory studies show normal white blood cell count of 4.5.  Patient underwent CT scan abdomen and pelvis showing signs of acute appendicitis with possible perforation and generalized peritonitis.  Patient has previous history of cesarean section, abdominoplasty, liposuction, and breast augmentation.  She is accompanied in the emergency department by her daughter.  Procedures Laparoscopic appendectomy by Dr. Harlow Asa on 07-16-17  Hospital Course:  The patient was admitted and started on zosyn.  During her surgery, she was found to have a perforated appendix.  This was removed with no difficulties.  On POD 1, she was doing well with minimal pain.  She was placed on clear liquids.  On POD 2, she was feeling bloated and not passing flatus.  She was advanced to full and began to mobilize well.   She had a BM on POD 3, and her diet was advanced to a soft diet.  She did not have a great appetite and was therefore kept another day to make sure she fully tolerated a diet prior to discharge.  Her WBC was normal on admission.  She was transitioned to augmentin on POD 3 from zosyn.  She will get a total of 7 days of antibiotic therapy.  She had no other issues during her admission and was otherwise stable to be discharged  home on POD 4.  Physical Exam: General: WN WF who is alert.  She looks good. HEENT: Normal. Pupils equal. Good dentition.  Lungs: Clear to auscultation and symmetric breath sounds. Heart:  RRR. No murmur or rub. Abdomen: Soft. No mass.  Normal bowel sounds.  Her incisions look good    Follow-up Information    Surgery, [place] Follow up on 08/06/2017.   Specialty:  General Surgery Why:  9:15am, arrive by 8:45am for check in and paperwork Contact information: [address] [place] [address] [phone]           Signed: Saverio Danker, [facility] 07/20/2017, 10:58 AM Pager: [phone]  Agree with above. Will complete Augmentin. To return to work on 07/25/2017. She is a Education officer, museum.  Alphonsa Overall, MD, [facility] Pager: [phone] Office phone:  [phone]

## 2017-07-19 NOTE — Progress Notes (Signed)
Patient ID: Kristin Gentry, female   DOB: 17-Sep-1950, 67 y.o.   MRN: [id number]    3 Days Post-Op  Subjective: Pt feels a little better.  She had a BM, but still not eating much.  Had some slight nausea, but no emesis and improved.  Objective: Vital signs in last 24 hours: Temp:  [98.2 F (36.8 C)-98.9 F (37.2 C)] 98.2 F (36.8 C) (05/24 0605) Pulse Rate:  [79-90] 79 (05/24 0605) Resp:  [16] 16 (05/24 0605) BP: (143-154)/(85-91) 143/88 (05/24 0605) SpO2:  [97 %-100 %] 97 % (05/24 0605) Last BM Date: 07/19/17  Intake/Output from previous day: 05/23 0701 - 05/24 0700 In: 1390.8 [P.O.:240; I.V.:1000.8; IV Piggyback:150] Out: 2125 [Urine:2125] Intake/Output this shift: Total I/O In: 240 [P.O.:240] Out: 375 [Urine:375]  PE: Abd: soft, +BS, ND, appropriately tender, incisions c/d/i with some slight ecchymosis noted around some of her incisions.  Lab Results:  Recent Labs    07/16/17 1355  WBC 4.5  HGB 15.5*  HCT 45.1  PLT 294   BMET Recent Labs    07/16/17 1355  NA 136  K 4.1  CL 98*  CO2 19*  GLUCOSE 108*  BUN 14  CREATININE 1.11*  CALCIUM 9.4   PT/INR No results for input(s): LABPROT, INR in the last 72 hours. CMP     Component Value Date/Time   NA 136 07/16/2017 1355   K 4.1 07/16/2017 1355   CL 98 (L) 07/16/2017 1355   CO2 19 (L) 07/16/2017 1355   GLUCOSE 108 (H) 07/16/2017 1355   BUN 14 07/16/2017 1355   CREATININE 1.11 (H) 07/16/2017 1355   CALCIUM 9.4 07/16/2017 1355   PROT 7.6 07/16/2017 1355   ALBUMIN 4.1 07/16/2017 1355   AST 43 (H) 07/16/2017 1355   ALT 64 (H) 07/16/2017 1355   ALKPHOS 61 07/16/2017 1355   BILITOT 1.9 (H) 07/16/2017 1355   GFRNONAA 51 (L) 07/16/2017 1355   GFRAA 59 (L) 07/16/2017 1355   Lipase     Component Value Date/Time   LIPASE 20 07/16/2017 1355       Studies/Results: No results found.  Anti-infectives: Anti-infectives (From admission, onward)   Start     Dose/Rate Route Frequency Ordered Stop   07/19/17 1000  amoxicillin-clavulanate (AUGMENTIN) 875-125 MG per tablet 1 tablet     1 tablet Oral Every 12 hours 07/19/17 0936 07/21/17 2159   07/16/17 2300  piperacillin-tazobactam (ZOSYN) IVPB 3.375 g  Status:  Discontinued     3.375 g 12.5 mL/hr over 240 Minutes Intravenous Every 8 hours 07/16/17 2104 07/19/17 0936   07/16/17 1715  piperacillin-tazobactam (ZOSYN) IVPB 3.375 g     3.375 g 100 mL/hr over 30 Minutes Intravenous  Once 07/16/17 1702 07/16/17 1752       Assessment/Plan perforated appendicitis, POD 3, s/p lap appy  -ileus improving.  Try soft diet -mobilize and pulm toilet -DC zosyn, change to oral augmentin  FEN -soft diet VTE -Lovenox/SCDs ID -zosyn DC today, oral augmentin for 5 more days  Dispo: plan for DC home tomorrow if tolerating diet     LOS: 0 days    Henreitta Cea , [facility] Surgery 07/19/2017, 9:41 AM Pager: [phone]

## 2017-07-19 NOTE — Care Management Obs Status (Signed)
[place] NOTIFICATION   Patient Details  Name: Kristin Gentry MRN: [id number] Date of Birth: 1950/05/17   Medicare Observation Status Notification Given:  Yes    Lynnell Catalan, RN 07/19/2017, 10:11 AM

## 2017-07-19 NOTE — Discharge Instructions (Signed)
LAPAROSCOPIC SURGERY: POST OP INSTRUCTIONS  1. DIET: Follow a light bland diet the first 24 hours after arrival home, such as soup, liquids, crackers, etc. Be sure to include lots of fluids daily. Avoid fast food or heavy meals as your are more likely to get nauseated. Eat a low fat the next few days after surgery.  2. Take your usually prescribed home medications unless otherwise directed. 3. PAIN CONTROL:  1. Pain is best controlled by a usual combination of three different methods TOGETHER:  i. Ice/Heat ii. Over the counter pain medication iii. Prescription pain medication 2. Most patients will experience some swelling and bruising around the incisions. Ice packs or heating pads (30-60 minutes up to 6 times a day) will help. Use ice for the first few days to help decrease swelling and bruising, then switch to heat to help relax tight/sore spots and speed recovery. Some people prefer to use ice alone, heat alone, alternating between ice & heat. Experiment to what works for you. Swelling and bruising can take several weeks to resolve.  3. It is helpful to take an over-the-counter pain medication regularly for the first few weeks. Choose one of the following that works best for you:  i. Naproxen (Aleve, etc) Two 220mg  tabs twice a day ii. Ibuprofen (Advil, etc) Three 200mg  tabs four times a day (every meal & bedtime) iii. Acetaminophen (Tylenol, etc) 500-650mg  four times a day (every meal & bedtime) 4. A prescription for pain medication (such as oxycodone, hydrocodone, etc) should be given to you upon discharge. Take your pain medication as prescribed.  i. If you are having problems/concerns with the prescription medicine (does not control pain, nausea, vomiting, rash, itching, etc), please call us [phone] to see if we need to switch you to a different pain medicine that will work better for you and/or control your side effect better. ii. If you need a refill on your pain medication, please  contact your pharmacy. They will contact our office to request authorization. Prescriptions will not be filled after 5 pm or on week-ends. 1. Avoid getting constipated. Between the surgery and the pain medications, it is common to experience some constipation. Increasing fluid intake and taking a fiber supplement (such as Metamucil, Citrucel, FiberCon, MiraLax, etc) 1-2 times a day regularly will usually help prevent this problem from occurring. A mild laxative (prune juice, Milk of Magnesia, MiraLax, etc) should be taken according to package directions if there are no bowel movements after 48 hours.  2. Watch out for diarrhea. If you have many loose bowel movements, simplify your diet to bland foods & liquids for a few days. Stop any stool softeners and decrease your fiber supplement. Switching to mild anti-diarrheal medications (Kayopectate, Pepto Bismol) can help. If this worsens or does not improve, please call us. 3. Wash / shower every day. You may shower over the dressings as they are waterproof. Continue to shower over incision(s) after the dressing is off. 4. Remove your waterproof bandages 5 days after surgery. You may leave the incision open to air. You may replace a dressing/Band-Aid to cover the incision for comfort if you wish.  5. ACTIVITIES as tolerated:  a. You may resume regular (light) daily activities beginning the next day--such as daily self-care, walking, climbing stairs--gradually increasing activities as tolerated. If you can walk 30 minutes without difficulty, it is safe to try more intense activity such as jogging, treadmill, bicycling, low-impact aerobics, swimming, etc. b. Save the most intensive and strenuous activity  for last such as sit-ups, heavy lifting, contact sports, etc Refrain from any heavy lifting or straining until you are off narcotics for pain control.  c. DO NOT PUSH THROUGH PAIN. Let pain be your guide: If it hurts to do something, don't do it. Pain is your body  warning you to avoid that activity for another week until the pain goes down. d. You may drive when you are no longer taking prescription pain medication, you can comfortably wear a seatbelt, and you can safely maneuver your car and apply brakes. e. Dennis Bast may have sexual intercourse when it is comfortable.  6. FOLLOW UP in our office  a. Please call [facility] at [phone] to set up an appointment to see your surgeon in the office for a follow-up appointment approximately 2-3 weeks after your surgery. b. Make sure that you call for this appointment the day you arrive home to insure a convenient appointment time.      10. IF YOU HAVE DISABILITY OR FAMILY LEAVE FORMS, BRING THEM TO THE               OFFICE FOR PROCESSING.   WHEN TO CALL us [phone]:  1. Poor pain control 2. Reactions / problems with new medications (rash/itching, nausea, etc)  3. Fever over 101.5 F (38.5 C) 4. Inability to urinate 5. Nausea and/or vomiting 6. Worsening swelling or bruising 7. Continued bleeding from incision. 8. Increased pain, redness, or drainage from the incision  The clinic staff is available to answer your questions during regular business hours (8:30am-5pm). Please dont hesitate to call and ask to speak to one of our nurses for clinical concerns.  If you have a medical emergency, go to the nearest emergency room or call 911.  A surgeon from [facility] is always on call at the [facility], [address], [address], [place], [place] [address] ?  MAIN: [phone] ? TOLL FREE: [phone] ?  FAX [phone]  [email or web address]

## 2017-07-20 ENCOUNTER — Encounter (HOSPITAL_COMMUNITY): Payer: Self-pay

## 2017-07-20 ENCOUNTER — Other Ambulatory Visit: Payer: Self-pay

## 2017-07-20 ENCOUNTER — Inpatient Hospital Stay (HOSPITAL_COMMUNITY)
Admission: EM | Admit: 2017-07-20 | Discharge: 2017-07-25 | DRG: 394 | Disposition: A | Discharge status: Home / Self Care | Payer: Medicare HMO | Attending: Surgery | Admitting: Surgery

## 2017-07-20 ENCOUNTER — Emergency Department (HOSPITAL_COMMUNITY): Payer: Medicare HMO

## 2017-07-20 DIAGNOSIS — Z9049 Acquired absence of other specified parts of digestive tract: Secondary | ICD-10-CM

## 2017-07-20 DIAGNOSIS — Z79890 Hormone replacement therapy (postmenopausal): Secondary | ICD-10-CM

## 2017-07-20 DIAGNOSIS — K661 Hemoperitoneum: Secondary | ICD-10-CM | POA: Diagnosis not present

## 2017-07-20 DIAGNOSIS — R9431 Abnormal electrocardiogram [ECG] [EKG]: Secondary | ICD-10-CM | POA: Diagnosis not present

## 2017-07-20 DIAGNOSIS — Z90710 Acquired absence of both cervix and uterus: Secondary | ICD-10-CM

## 2017-07-20 DIAGNOSIS — Z833 Family history of diabetes mellitus: Secondary | ICD-10-CM

## 2017-07-20 DIAGNOSIS — R55 Syncope and collapse: Secondary | ICD-10-CM | POA: Diagnosis not present

## 2017-07-20 DIAGNOSIS — D649 Anemia, unspecified: Secondary | ICD-10-CM | POA: Diagnosis not present

## 2017-07-20 DIAGNOSIS — Z87891 Personal history of nicotine dependence: Secondary | ICD-10-CM

## 2017-07-20 DIAGNOSIS — Z8249 Family history of ischemic heart disease and other diseases of the circulatory system: Secondary | ICD-10-CM

## 2017-07-20 DIAGNOSIS — Z8349 Family history of other endocrine, nutritional and metabolic diseases: Secondary | ICD-10-CM

## 2017-07-20 DIAGNOSIS — J9811 Atelectasis: Secondary | ICD-10-CM | POA: Diagnosis present

## 2017-07-20 DIAGNOSIS — H5462 Unqualified visual loss, left eye, normal vision right eye: Secondary | ICD-10-CM | POA: Diagnosis present

## 2017-07-20 DIAGNOSIS — E876 Hypokalemia: Secondary | ICD-10-CM | POA: Diagnosis present

## 2017-07-20 DIAGNOSIS — R1013 Epigastric pain: Secondary | ICD-10-CM | POA: Diagnosis not present

## 2017-07-20 DIAGNOSIS — R58 Hemorrhage, not elsewhere classified: Secondary | ICD-10-CM | POA: Diagnosis present

## 2017-07-20 DIAGNOSIS — D62 Acute posthemorrhagic anemia: Secondary | ICD-10-CM | POA: Diagnosis present

## 2017-07-20 DIAGNOSIS — K9189 Other postprocedural complications and disorders of digestive system: Secondary | ICD-10-CM | POA: Diagnosis present

## 2017-07-20 DIAGNOSIS — I959 Hypotension, unspecified: Secondary | ICD-10-CM | POA: Diagnosis present

## 2017-07-20 DIAGNOSIS — K219 Gastro-esophageal reflux disease without esophagitis: Secondary | ICD-10-CM | POA: Diagnosis present

## 2017-07-20 DIAGNOSIS — E039 Hypothyroidism, unspecified: Secondary | ICD-10-CM | POA: Diagnosis present

## 2017-07-20 DIAGNOSIS — K922 Gastrointestinal hemorrhage, unspecified: Secondary | ICD-10-CM

## 2017-07-20 LAB — CBC WITH DIFFERENTIAL/PLATELET
BASOS PCT: 0 %
Band Neutrophils: 1 %
Basophils Absolute: 0 10*3/uL (ref 0.0–0.1)
Eosinophils Absolute: 0 10*3/uL (ref 0.0–0.7)
Eosinophils Relative: 0 %
HCT: 36.5 % (ref 36.0–46.0)
HEMOGLOBIN: 12.2 g/dL (ref 12.0–15.0)
Lymphocytes Relative: 5 %
Lymphs Abs: 0.7 10*3/uL (ref 0.7–4.0)
MCH: 33.5 pg (ref 26.0–34.0)
MCHC: 33.4 g/dL (ref 30.0–36.0)
MCV: 100.3 fL — ABNORMAL HIGH (ref 78.0–100.0)
Metamyelocytes Relative: 4 %
Monocytes Absolute: 0.3 10*3/uL (ref 0.1–1.0)
Monocytes Relative: 2 %
Myelocytes: 2 %
NEUTROS ABS: 13.4 10*3/uL — AB (ref 1.7–7.7)
Neutrophils Relative %: 86 %
Platelets: 304 10*3/uL (ref 150–400)
RBC: 3.64 MIL/uL — ABNORMAL LOW (ref 3.87–5.11)
RDW: 12.4 % (ref 11.5–15.5)
WBC: 14.4 10*3/uL — ABNORMAL HIGH (ref 4.0–10.5)

## 2017-07-20 LAB — URINALYSIS, ROUTINE W REFLEX MICROSCOPIC
Bacteria, UA: NONE SEEN
Bilirubin Urine: NEGATIVE
GLUCOSE, UA: NEGATIVE mg/dL
HGB URINE DIPSTICK: NEGATIVE
KETONES UR: 20 mg/dL — AB
Leukocytes, UA: NEGATIVE
NITRITE: NEGATIVE
PH: 5 (ref 5.0–8.0)
Protein, ur: 30 mg/dL — AB
Specific Gravity, Urine: 1.019 (ref 1.005–1.030)

## 2017-07-20 LAB — COMPREHENSIVE METABOLIC PANEL
ALBUMIN: 2.7 g/dL — AB (ref 3.5–5.0)
ALT: 22 U/L (ref 14–54)
ANION GAP: 13 (ref 5–15)
AST: 17 U/L (ref 15–41)
Alkaline Phosphatase: 42 U/L (ref 38–126)
BILIRUBIN TOTAL: 0.4 mg/dL (ref 0.3–1.2)
BUN: 8 mg/dL (ref 6–20)
CO2: 19 mmol/L — AB (ref 22–32)
Calcium: 8.2 mg/dL — ABNORMAL LOW (ref 8.9–10.3)
Chloride: 105 mmol/L (ref 101–111)
Creatinine, Ser: 0.67 mg/dL (ref 0.44–1.00)
GFR calc Af Amer: >60 mL/min (ref >60)
GFR calc non Af Amer: >60 mL/min (ref >60)
GLUCOSE: 131 mg/dL — AB (ref 65–99)
POTASSIUM: 3.4 mmol/L — AB (ref 3.5–5.1)
SODIUM: 137 mmol/L (ref 135–145)
TOTAL PROTEIN: 6 g/dL — AB (ref 6.5–8.1)

## 2017-07-20 LAB — I-STAT CHEM 8, ED
BUN: 6 mg/dL (ref 6–20)
CHLORIDE: 104 mmol/L (ref 101–111)
CREATININE: 0.6 mg/dL (ref 0.44–1.00)
Calcium, Ion: 1.1 mmol/L — ABNORMAL LOW (ref 1.15–1.40)
GLUCOSE: 126 mg/dL — AB (ref 65–99)
HCT: 37 % (ref 36.0–46.0)
Hemoglobin: 12.6 g/dL (ref 12.0–15.0)
POTASSIUM: 3.4 mmol/L — AB (ref 3.5–5.1)
Sodium: 138 mmol/L (ref 135–145)
TCO2: 20 mmol/L — ABNORMAL LOW (ref 22–32)

## 2017-07-20 LAB — LIPASE, BLOOD: Lipase: 27 U/L (ref 11–51)

## 2017-07-20 MED ORDER — HYDROCODONE-ACETAMINOPHEN 5-325 MG PO TABS: 1.0000 | ORAL_TABLET | Freq: Four times a day (QID) | ORAL | 0 refills | Status: DC | PRN

## 2017-07-20 MED ORDER — MORPHINE SULFATE (PF) 4 MG/ML IV SOLN
4.0000 mg | Freq: Once | INTRAVENOUS | Status: AC
  Administered 2017-07-20: 4 mg via INTRAVENOUS
  Filled 2017-07-20: qty 1

## 2017-07-20 MED ORDER — IOPAMIDOL (ISOVUE-300) INJECTION 61%
INTRAVENOUS | Status: AC
  Filled 2017-07-20: qty 100

## 2017-07-20 MED ORDER — ONDANSETRON HCL 4 MG/2ML IJ SOLN
4.0000 mg | Freq: Once | INTRAMUSCULAR | Status: AC
  Administered 2017-07-20: 4 mg via INTRAVENOUS
  Filled 2017-07-20: qty 2

## 2017-07-20 MED ORDER — IOPAMIDOL (ISOVUE-300) INJECTION 61%
100.0000 mL | Freq: Once | INTRAVENOUS | Status: AC | PRN
  Administered 2017-07-20: 100 mL via INTRAVENOUS

## 2017-07-20 MED ORDER — SODIUM CHLORIDE 0.9 % IV BOLUS
1000.0000 mL | Freq: Once | INTRAVENOUS | Status: AC
  Administered 2017-07-20: 1000 mL via INTRAVENOUS

## 2017-07-20 NOTE — ED Triage Notes (Signed)
Patient discharged earlier today and is felling weak and dizzy. Color is pale

## 2017-07-20 NOTE — ED Provider Notes (Cosign Needed Addendum)
[facility] DEPT Provider Note   CSN: [id number] Arrival date & time: 07/20/17  1959     History   Chief Complaint Chief Complaint  Patient presents with  . Fatigue    HPI Kristin Gentry is a 67 y.o. female who presents the emergency department today for postop pain with a presyncopal episode.  Patient presented here on 5/21 was noted to have a perforated appendix that was removed by Dr. Harlow Asa on 5/21 after patient was started on Zosyn.  Her diet was advanced and she was discharged home with a prescription for pain medication as well as Augmentin (she has not taken her first dose).  She is to follow-up with [facility] surgery on 08/06/2017.   Patient reports that after she was discharged home today she started feeling increasing abdominal pain.  Notes that she felt generalized weakness as well as lightheaded.  When she stood she felt like she was going to pass out but did not.  She denies any vertigo, focal weakness, numbness/tingling of the extremities, headache, visual changes.  She reports generalized abdominal pain with constant nausea and one episode of nonbilious, nonbloody emesis. She has taken her hydrocodone as well as Zofran without relief.  She was transferred by EMS and found to have a systolic blood pressure of 80 improved to 144/88 after fluid bolus.  Patient notes that she has not had a bowel movement since surgery.  She was passing flatus at time of discharge.  She denies any urinary symptoms.  No fever, chills, chest pain, shortness of breath.  No bilious or bloody emesis.    HPI  Past Medical History:  Diagnosis Date  . Abnormal Pap smear of cervix 1990  . Arthritis    left knee  . 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 Cancer ([place]) 1989   cervical  . GERD (gastroesophageal reflux disease)   . Hypothyroidism   . 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 Thyroid disease    hypothyroid    Patient Active Problem List   Diagnosis Date  Noted  . Appendicitis, acute 07/16/2017  . Acute appendicitis 07/16/2017    Past Surgical History:  Procedure Laterality Date  . AUGMENTATION MAMMAPLASTY  07/2005   --saline  . CESAREAN SECTION  1986  . COLONOSCOPY WITH PROPOFOL N/A 11/12/2013   Procedure: COLONOSCOPY WITH PROPOFOL;  Surgeon: Garlan Fair, MD;  Location: [facility];  Service: Endoscopy;  Laterality: N/A;  . [place]   of cervix  . LAPAROSCOPIC APPENDECTOMY N/A 07/16/2017   Procedure: APPENDECTOMY LAPAROSCOPIC;  Surgeon: Armandina Gemma, MD;  Location: [facility] ORS;  Service: General;  Laterality: N/A;  . Total Vaginal Hysterectomy/BSO  2001   --fibroids     OB History    Gravida  4   Para  2   Term  2   Preterm      AB  2   Living  2     SAB  2   TAB      Ectopic      Multiple      Live Births               Home Medications    Prior to Admission medications   Medication Sig Start Date End Date Taking? Authorizing Provider  acetaminophen (TYLENOL) 500 MG tablet Take 1,000 mg by mouth every 6 (six) hours as needed for headache.   Yes [provider]  amoxicillin (AMOXIL) 500 MG  capsule Take 500 mg by mouth 2 (two) times daily.   Yes [provider]  calcium carbonate (OS-CAL) 600 MG TABS tablet Take 600 mg by mouth 2 (two) times daily with a meal.   Yes [provider]  cholecalciferol (VITAMIN D) 1000 UNITS tablet Take 1,000 Units by mouth every morning.    Yes [provider]  HYDROcodone-acetaminophen (NORCO/VICODIN) 5-325 MG tablet Take 1-2 tablets by mouth every 6 (six) hours as needed for moderate pain. 07/20/17  Yes Alphonsa Overall, MD  levothyroxine (SYNTHROID, LEVOTHROID) 88 MCG tablet Take 88 mcg by mouth daily before breakfast.   Yes [provider]  Polyethyl Glycol-Propyl Glycol (SYSTANE) 0.4-0.3 % SOLN Place 1 drop into both eyes daily.   Yes [provider]  risedronate (ACTONEL) 150 MG tablet Take 150 mg by  mouth every 30 (thirty) days. 06/11/17  Yes [provider]    Family History Family History  Problem Relation Age of Onset  . Hypertension Mother   . Heart disease Mother   . Thyroid disease Mother        hypothyroid  . Diabetes Father   . Hypertension Father   . Heart disease Father   . Thyroid disease Sister        hypothyroid    Social History Social History   Tobacco Use  . Smoking status: Former Smoker    Packs/day: 0.50    Years: 10.00    Pack years: 5.00    Types: Cigarettes    Last attempt to quit: 02/27/2003    Years since quitting: 14.4  . Smokeless tobacco: Never Used  Substance Use Topics  . Alcohol use: Yes    Alcohol/week: 1.0 oz    Types: 2 drink(s) per week    Comment: socially  . Drug use: No     Allergies   Patient has no known allergies.   Review of Systems Review of Systems  All other systems reviewed and are negative.    Physical Exam Updated Vital Signs BP 106/64 (BP Location: Left Arm)   Pulse (!) 104   Temp 97.8 F (36.6 C) (Oral)   Resp 17   SpO2 97%   Physical Exam  Constitutional: She appears well-developed and well-nourished.  HENT:  Head: Normocephalic and atraumatic.  Right Ear: External ear normal.  Left Ear: External ear normal.  Nose: Nose normal.  Mouth/Throat: Uvula is midline, oropharynx is clear and moist and mucous membranes are normal. No tonsillar exudate.  Eyes: Pupils are equal, round, and reactive to light. Right eye exhibits no discharge. Left eye exhibits no discharge. No scleral icterus.  Neck: Trachea normal. Neck supple. No spinous process tenderness present. No neck rigidity. Normal range of motion present.  Cardiovascular: Normal rate, regular rhythm and intact distal pulses.  No murmur heard. Pulses:      Radial pulses are 2+ on the right side, and 2+ on the left side.       Dorsalis pedis pulses are 2+ on the right side, and 2+ on the left side.       Posterior tibial pulses are 2+ on  the right side, and 2+ on the left side.  No lower extremity swelling or edema. Calves symmetric in size bilaterally.  Pulmonary/Chest: Effort normal and breath sounds normal. She exhibits no tenderness.  Abdominal: Soft. Bowel sounds are normal. She exhibits no distension. There is generalized tenderness and tenderness in the right lower quadrant and periumbilical area. There is no rigidity, no rebound,  no guarding and no CVA tenderness.  Post operative laparoscopic scars healing as apporpriate. No evidence of surrodning infection.   Musculoskeletal: She exhibits no edema.  Lymphadenopathy:    She has no cervical adenopathy.  Neurological: She is alert.  Skin: Skin is warm and dry. No rash noted. She is not diaphoretic.  Psychiatric: She has a normal mood and affect.  Nursing note and vitals reviewed.   ED Treatments / Results  Labs (all labs ordered are listed, but only abnormal results are displayed) Labs Reviewed  COMPREHENSIVE METABOLIC PANEL - Abnormal; Notable for the following components:      Result Value   Potassium 3.4 (*)    CO2 19 (*)    Glucose, Bld 131 (*)    Calcium 8.2 (*)    Total Protein 6.0 (*)    Albumin 2.7 (*)    All other components within normal limits  CBC WITH DIFFERENTIAL/PLATELET - Abnormal; Notable for the following components:   WBC 14.4 (*)    RBC 3.64 (*)    MCV 100.3 (*)    Neutro Abs 13.4 (*)    All other components within normal limits  URINALYSIS, ROUTINE W REFLEX MICROSCOPIC - Abnormal; Notable for the following components:   APPearance HAZY (*)    Ketones, ur 20 (*)    Protein, ur 30 (*)    All other components within normal limits  I-STAT CHEM 8, ED - Abnormal; Notable for the following components:   Potassium 3.4 (*)    Glucose, Bld 126 (*)    Calcium, Ion 1.10 (*)    TCO2 20 (*)    All other components within normal limits  LIPASE, BLOOD    EKG None  Radiology No results found.  Procedures Procedures (including critical  care time)  Medications Ordered in ED Medications  morphine 4 MG/ML injection 4 mg (has no administration in time range)  sodium chloride 0.9 % bolus 1,000 mL (has no administration in time range)  ondansetron (ZOFRAN) injection 4 mg (has no administration in time range)     Initial Impression / Assessment and Plan / ED Course  I have reviewed the triage vital signs and the nursing notes.  Pertinent labs & imaging results that were available during my care of the patient were reviewed by me and considered in my medical decision making (see chart for details).     67 y.o. female who presents the emergency department today for postop pain with a presyncopal episode.  Patient presented here on 5/21 was noted to have a perforated appendix that was removed by Dr. Harlow Asa on 5/21 after patient was started on Zosyn.  Her diet was advanced and she was discharged home with a prescription for pain medication as well as Augmentin (she has not taken her first dose).  She is presenting today for generalized abdominal pain with nausea and vomiting as well as a presyncopal episode since discharge earlier today.  Patient was found to have hypotension with a blood pressure of 80 mmHg systolic, which improved after fluid bolus.  On arrival patient is tachycardic.  She is afebrile without tachypnea, or hypoxia.  Patient does have abdominal pain without peritoneal signs as above.  I do suspect a mild amount of abdominal pain is common as patient underwent surgery 4 days ago.  Labs ordered.  IV fluids, nausea and pain medication ordered.  Patient made n.p.o.  I discussed the case with Dr. Lucia Gaskins, who recommended obtaining lab work.  He states if  white blood cell count within normal limits, she will not require a CT scan.  If patient has a leukocytosis requested a CT scan be ordered.  When patient was admitted on 5/21 for perforated appendicitis she did not have a leukocytosis at that time.  Requested that regardless  patient be admitted today.  No CT scan is required and requested that patient be admitted for observation given episode of hypotension with D5 1/2 NS + 20 KCl @ 125cc/hr be ordered with repeat CBC and electrolytes in the morning and a clear liquid diet.  Also recommended starting patient on antibiotics when admitted (supposed to be on Augmentin).  Recommended the patient be admitted to [facility] and he will have his team evaluate in the morning.  Patient does have a leukocytosis of 14.4.  Will order CT scan.  Cr is within normal limits.  Will order with contrast.  Patient is without anemia.  No significant electrolyte derangements.  Mild hypokalemia of 3.4.  Likely from patient's emesis.  No acute kidney injury.  LFTs within normal limits.  No anion gap acidosis.  Lipase within normal limits.  No evidence of UTI. EKG NSR.   With CT scan pending Case signed out to Carmon Sails, PA-C.  If CT scan is negative, plan to admit patient for observation.  If CT scan shows pathology, plan to consult surgery for recommendations or workup how she feels necessary.   Patient case discussed with Dr. Roderic Palau who is in agreement with plan.   Final Clinical Impressions(s) / ED Diagnoses   Final diagnoses:  None    ED Discharge Orders    None       Jillyn Ledger, PA-C 07/21/17 0002    Jillyn Ledger, PA-C 07/21/17 [phone]

## 2017-07-20 NOTE — Progress Notes (Signed)
Assessment unchanged. Pt verbalized understanding of dc instructions through teach back including when to call MD and follow up care. Script x 1 given per MD. Discharged via wc to front entrance accompanied by NT.

## 2017-07-20 NOTE — ED Notes (Signed)
Bed: VD73 Expected date:  Expected time:  Means of arrival:  Comments: EMS 67 yo female just released after having appy this past Tuesday-SBP 80 now 144/88-NS fluid bolus HR 104

## 2017-07-21 ENCOUNTER — Emergency Department (HOSPITAL_COMMUNITY): Payer: Medicare HMO

## 2017-07-21 ENCOUNTER — Inpatient Hospital Stay: Payer: Self-pay

## 2017-07-21 DIAGNOSIS — E876 Hypokalemia: Secondary | ICD-10-CM | POA: Diagnosis present

## 2017-07-21 DIAGNOSIS — R58 Hemorrhage, not elsewhere classified: Secondary | ICD-10-CM

## 2017-07-21 DIAGNOSIS — K9189 Other postprocedural complications and disorders of digestive system: Secondary | ICD-10-CM | POA: Diagnosis not present

## 2017-07-21 DIAGNOSIS — H5462 Unqualified visual loss, left eye, normal vision right eye: Secondary | ICD-10-CM | POA: Diagnosis not present

## 2017-07-21 DIAGNOSIS — S36892A Contusion of other intra-abdominal organs, initial encounter: Secondary | ICD-10-CM | POA: Diagnosis not present

## 2017-07-21 DIAGNOSIS — R1013 Epigastric pain: Secondary | ICD-10-CM | POA: Diagnosis present

## 2017-07-21 DIAGNOSIS — K219 Gastro-esophageal reflux disease without esophagitis: Secondary | ICD-10-CM | POA: Diagnosis not present

## 2017-07-21 DIAGNOSIS — Z90710 Acquired absence of both cervix and uterus: Secondary | ICD-10-CM | POA: Diagnosis not present

## 2017-07-21 DIAGNOSIS — Z79890 Hormone replacement therapy (postmenopausal): Secondary | ICD-10-CM | POA: Diagnosis not present

## 2017-07-21 DIAGNOSIS — E039 Hypothyroidism, unspecified: Secondary | ICD-10-CM | POA: Diagnosis not present

## 2017-07-21 DIAGNOSIS — Z87891 Personal history of nicotine dependence: Secondary | ICD-10-CM | POA: Diagnosis not present

## 2017-07-21 DIAGNOSIS — Z8349 Family history of other endocrine, nutritional and metabolic diseases: Secondary | ICD-10-CM | POA: Diagnosis not present

## 2017-07-21 DIAGNOSIS — Z8249 Family history of ischemic heart disease and other diseases of the circulatory system: Secondary | ICD-10-CM | POA: Diagnosis not present

## 2017-07-21 DIAGNOSIS — Z833 Family history of diabetes mellitus: Secondary | ICD-10-CM | POA: Diagnosis not present

## 2017-07-21 DIAGNOSIS — D62 Acute posthemorrhagic anemia: Secondary | ICD-10-CM | POA: Diagnosis not present

## 2017-07-21 DIAGNOSIS — J9811 Atelectasis: Secondary | ICD-10-CM | POA: Diagnosis not present

## 2017-07-21 DIAGNOSIS — K661 Hemoperitoneum: Secondary | ICD-10-CM | POA: Diagnosis not present

## 2017-07-21 DIAGNOSIS — I959 Hypotension, unspecified: Secondary | ICD-10-CM | POA: Diagnosis present

## 2017-07-21 HISTORY — DX: Hemorrhage, not elsewhere classified: R58

## 2017-07-21 LAB — HEMOGLOBIN AND HEMATOCRIT, BLOOD
HCT: 31.3 % — ABNORMAL LOW (ref 36.0–46.0)
HCT: 26 % — ABNORMAL LOW (ref 36.0–46.0)
Hemoglobin: 10.5 g/dL — ABNORMAL LOW (ref 12.0–15.0)
Hemoglobin: 8.8 g/dL — ABNORMAL LOW (ref 12.0–15.0)

## 2017-07-21 LAB — ABO/RH: ABO/RH(D): A POS

## 2017-07-21 LAB — PROTIME-INR
INR: 1.08
Prothrombin Time: 13.9 seconds (ref 11.4–15.2)

## 2017-07-21 LAB — CBC WITH DIFFERENTIAL/PLATELET
BASOS PCT: 2 %
Basophils Absolute: 0.2 10*3/uL — ABNORMAL HIGH (ref 0.0–0.1)
EOS ABS: 0.1 10*3/uL (ref 0.0–0.7)
Eosinophils Relative: 1 %
HCT: 24 % — ABNORMAL LOW (ref 36.0–46.0)
Hemoglobin: 8.1 g/dL — ABNORMAL LOW (ref 12.0–15.0)
LYMPHS PCT: 16 %
Lymphs Abs: 1.4 10*3/uL (ref 0.7–4.0)
MCH: 33.2 pg (ref 26.0–34.0)
MCHC: 33.8 g/dL (ref 30.0–36.0)
MCV: 98.4 fL (ref 78.0–100.0)
Monocytes Absolute: 2.1 10*3/uL — ABNORMAL HIGH (ref 0.1–1.0)
Monocytes Relative: 24 %
NEUTROS PCT: 57 %
Neutro Abs: 5 10*3/uL (ref 1.7–7.7)
Platelets: 291 10*3/uL (ref 150–400)
RBC: 2.44 MIL/uL — AB (ref 3.87–5.11)
RDW: 12.6 % (ref 11.5–15.5)
WBC: 8.8 10*3/uL (ref 4.0–10.5)

## 2017-07-21 LAB — TYPE AND SCREEN
ABO/RH(D): A POS
Antibody Screen: NEGATIVE

## 2017-07-21 LAB — CBC
HCT: 27.2 % — ABNORMAL LOW (ref 36.0–46.0)
Hemoglobin: 9.1 g/dL — ABNORMAL LOW (ref 12.0–15.0)
MCH: 33.1 pg (ref 26.0–34.0)
MCHC: 33.5 g/dL (ref 30.0–36.0)
MCV: 98.9 fL (ref 78.0–100.0)
PLATELETS: 304 10*3/uL (ref 150–400)
RBC: 2.75 MIL/uL — ABNORMAL LOW (ref 3.87–5.11)
RDW: 12.7 % (ref 11.5–15.5)
WBC: 10.1 10*3/uL (ref 4.0–10.5)

## 2017-07-21 LAB — PLATELET COUNT: Platelets: 302 10*3/uL (ref 150–400)

## 2017-07-21 LAB — FIBRINOGEN: Fibrinogen: 730 mg/dL — ABNORMAL HIGH (ref 210–475)

## 2017-07-21 LAB — APTT: APTT: 29 s (ref 24–36)

## 2017-07-21 MED ORDER — ONDANSETRON HCL 4 MG/2ML IJ SOLN
4.0000 mg | Freq: Four times a day (QID) | INTRAMUSCULAR | Status: DC | PRN
  Administered 2017-07-22: 4 mg via INTRAVENOUS
  Filled 2017-07-21: qty 2

## 2017-07-21 MED ORDER — MORPHINE SULFATE (PF) 2 MG/ML IV SOLN: 1.0000 mg | INTRAVENOUS | Status: DC | PRN

## 2017-07-21 MED ORDER — HYDROCODONE-ACETAMINOPHEN 5-325 MG PO TABS
1.0000 | ORAL_TABLET | ORAL | Status: DC | PRN
  Administered 2017-07-21 (×2): 2 via ORAL
  Administered 2017-07-22: 1 via ORAL
  Administered 2017-07-22: 2 via ORAL
  Administered 2017-07-22: 1 via ORAL
  Administered 2017-07-23: 2 via ORAL
  Filled 2017-07-21 (×4): qty 2
  Filled 2017-07-21: qty 1
  Filled 2017-07-21: qty 2

## 2017-07-21 MED ORDER — ONDANSETRON HCL 4 MG/2ML IJ SOLN
4.0000 mg | Freq: Once | INTRAMUSCULAR | Status: AC
  Administered 2017-07-21: 4 mg via INTRAVENOUS
  Filled 2017-07-21: qty 2

## 2017-07-21 MED ORDER — ONDANSETRON 4 MG PO TBDP
4.0000 mg | ORAL_TABLET | Freq: Four times a day (QID) | ORAL | Status: DC | PRN
  Administered 2017-07-22 – 2017-07-25 (×2): 4 mg via ORAL
  Filled 2017-07-21 (×3): qty 1

## 2017-07-21 MED ORDER — IOPAMIDOL (ISOVUE-370) INJECTION 76%
INTRAVENOUS | Status: AC
  Filled 2017-07-21: qty 100

## 2017-07-21 MED ORDER — IOPAMIDOL (ISOVUE-370) INJECTION 76%
100.0000 mL | Freq: Once | INTRAVENOUS | Status: AC | PRN
  Administered 2017-07-21: 75 mL via INTRAVENOUS

## 2017-07-21 MED ORDER — KCL IN DEXTROSE-NACL 20-5-0.45 MEQ/L-%-% IV SOLN
INTRAVENOUS | Status: DC
  Administered 2017-07-21 (×2): 150 mL/h via INTRAVENOUS
  Administered 2017-07-23 – 2017-07-24 (×3): via INTRAVENOUS
  Filled 2017-07-21 (×8): qty 1000

## 2017-07-21 MED ORDER — LEVOTHYROXINE SODIUM 100 MCG IV SOLR
25.0000 ug | Freq: Every day | INTRAVENOUS | Status: DC
  Administered 2017-07-21 – 2017-07-24 (×4): 25 ug via INTRAVENOUS
  Filled 2017-07-21 (×4): qty 5

## 2017-07-21 MED ORDER — PANTOPRAZOLE SODIUM 40 MG IV SOLR
40.0000 mg | Freq: Two times a day (BID) | INTRAVENOUS | Status: DC
Start: 2017-07-21 — End: 2017-07-24
  Administered 2017-07-21 – 2017-07-24 (×7): 40 mg via INTRAVENOUS
  Filled 2017-07-21 (×9): qty 40

## 2017-07-21 MED ORDER — PIPERACILLIN-TAZOBACTAM 3.375 G IVPB
3.3750 g | Freq: Three times a day (TID) | INTRAVENOUS | Status: DC
  Administered 2017-07-21 – 2017-07-24 (×10): 3.375 g via INTRAVENOUS
  Filled 2017-07-21 (×9): qty 50

## 2017-07-21 MED ORDER — MORPHINE SULFATE (PF) 4 MG/ML IV SOLN
4.0000 mg | Freq: Once | INTRAVENOUS | Status: AC
  Administered 2017-07-21: 4 mg via INTRAVENOUS
  Filled 2017-07-21: qty 1

## 2017-07-21 MED ORDER — CHLORHEXIDINE GLUCONATE CLOTH 2 % EX PADS
6.0000 | MEDICATED_PAD | Freq: Every day | CUTANEOUS | Status: DC
  Administered 2017-07-21 – 2017-07-24 (×4): 6 via TOPICAL

## 2017-07-21 MED ORDER — SODIUM CHLORIDE 0.9% FLUSH: 10.0000 mL | INTRAVENOUS | Status: DC | PRN

## 2017-07-21 MED ORDER — SODIUM CHLORIDE 0.9% FLUSH
10.0000 mL | Freq: Two times a day (BID) | INTRAVENOUS | Status: DC
  Administered 2017-07-21: 20 mL
  Administered 2017-07-21: 10 mL
  Administered 2017-07-22: 20 mL
  Administered 2017-07-23 (×2): 10 mL

## 2017-07-21 NOTE — Plan of Care (Signed)
POC discussed with allowance for all questions to be asked and answered; patient reported understanding.

## 2017-07-21 NOTE — Progress Notes (Signed)
Peripherally Inserted Central Catheter/Midline Placement  The IV Nurse has discussed with the patient and/or persons authorized to consent for the patient, the purpose of this procedure and the potential benefits and risks involved with this procedure.  The benefits include less needle sticks, lab draws from the catheter, and the patient may be discharged home with the catheter. Risks include, but not limited to, infection, bleeding, blood clot (thrombus formation), and puncture of an artery; nerve damage and irregular heartbeat and possibility to perform a PICC exchange if needed/ordered by physician.  Alternatives to this procedure were also discussed.  Bard Power PICC patient education guide, fact sheet on infection prevention and patient information card has been provided to patient /or left at bedside.    PICC/Midline Placement Documentation  PICC Double Lumen 03/50/09 PICC Right Basilic 35 cm 0 cm (Active)  Indication for Insertion or Continuance of Line Prolonged intravenous therapies;Limited venous access - need for IV therapy >5 days (PICC only) 07/21/2017  1:02 PM  Exposed Catheter (cm) 0 cm 07/21/2017  1:02 PM  Site Assessment Clean;Dry;Intact 07/21/2017  1:02 PM  Lumen #1 Status Flushed;Saline locked;Blood return noted 07/21/2017  1:02 PM  Lumen #2 Status Flushed;Saline locked;Blood return noted 07/21/2017  1:02 PM  Dressing Type Transparent 07/21/2017  1:02 PM  Dressing Status Clean;Dry;Intact;Antimicrobial disc in place 07/21/2017  1:02 PM  Line Care Connections checked and tightened 07/21/2017  1:02 PM  Line Adjustment (NICU/IV Team Only) No 07/21/2017  1:02 PM  Dressing Intervention New dressing 07/21/2017  1:02 PM  Dressing Change Due 07/28/17 07/21/2017  1:02 PM       Kristin Gentry 07/21/2017, 1:04 PM

## 2017-07-21 NOTE — Progress Notes (Signed)
SURGERY NOTIFIED OF DROP IN H&H; NO NEW ORDERS RECEIVED.

## 2017-07-21 NOTE — Progress Notes (Signed)
Spoke with pt re PICC line placement, risks and benefits.  Pt requests to speak with son prior to decision.  RN notified.

## 2017-07-21 NOTE — ED Notes (Signed)
Patient transported to CT 

## 2017-07-21 NOTE — ED Notes (Signed)
Pt refusing blood work until [employer] speaks with pt about results of CT and gets a little more information about why pending lab work needs to be collected. PA has been notified and will speak with pt.

## 2017-07-21 NOTE — Progress Notes (Signed)
MD PAGED TO REPORT H&H (8.1/24.0) VALUE.

## 2017-07-21 NOTE — H&P (Addendum)
Re:   Kristin Gentry DOB:   04-09-1950 MRN:   [id number]   General surgery admission  Chief Complaint Abdominal pain  ASSESEMENT AND PLAN: 1.  Retroperitoneal hemorrhage, questionable duodenal perforation  Plan:  NPO, IVF (will place PICC), antibiotics, anti ulcer meds, repeat labs and CT scans  2.  History of recent ruptured appendicitis  Lap appendectomy on 07/16/2017 by Dr. Harlow Asa  Discharged from [facility] on 07/20/2017  The current problem appears unrelated to her appendicitis.  3.  Blind left eye 4.  GERD, mild 5.  Hypothyroidism 6.  Recently started Actonel for osteoporosis.  Chief Complaint  Patient presents with  . Fatigue   PHYSICIAN REQUESTING CONSULTATION: Lavone Orn, MD  HISTORY OF PRESENT ILLNESS: Kristin Gentry is a 67 y.o. (DOB: April 15, 1950)  white female whose primary care physician is Lavone Orn, MD.  Ms. Lovan had a ruptured appendix treated with lap appendectomy by Dr. Harlow Asa on 07/16/2017.  She did well and went home yesterday in good shape.  Yesterday afternoon, she experienced sudden onset of epigastric abdominal pain.  She was nauseated, vomited once, and was brought back [facility] the [facility] by ambulance.  She has some mild GERD.  But no other chronic GI complaints. She has no history of ulcer disease.  Her mother required surgery for ulcer disease.  She has no liver or pancreas disease. She had a negative colonoscopy by Dr. Mellody Memos in 2015.  Her only other abdominal surgery was a vag hysterectomy in 2001.  She has been started on Actonel, which is associated with ulcers.  CT abdomen/pelvis - 07/20/2017 - Large retroperitoneal hematoma is again demonstrated. Small amount of free fluid throughout the abdomen and pelvis consistent with hemorrhagic fluid also again demonstrated. Small gas and fluid collection in the retroperitoneum adjacent to the duodenum is also unchanged. Small bilateral pleural effusions with basilar atelectasis. Postoperative  changes consistent with recent appendectomy. No abscess identified.    Past Medical History:  Diagnosis Date  . Abnormal Pap smear of cervix 1990  . Arthritis    left knee  . 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 Cancer ([place]) 1989   cervical  . GERD (gastroesophageal reflux disease)   . Hypothyroidism   . 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 Thyroid disease    hypothyroid      Past Surgical History:  Procedure Laterality Date  . AUGMENTATION MAMMAPLASTY  07/2005   --saline  . CESAREAN SECTION  1986  . COLONOSCOPY WITH PROPOFOL N/A 11/12/2013   Procedure: COLONOSCOPY WITH PROPOFOL;  Surgeon: Garlan Fair, MD;  Location: [facility];  Service: Endoscopy;  Laterality: N/A;  . [place]   of cervix  . LAPAROSCOPIC APPENDECTOMY N/A 07/16/2017   Procedure: APPENDECTOMY LAPAROSCOPIC;  Surgeon: Armandina Gemma, MD;  Location: [facility];  Service: General;  Laterality: N/A;  . Total Vaginal Hysterectomy/BSO  2001   --fibroids      Current Facility-Administered Medications  Medication Dose Route Frequency Provider Last Rate Last Dose  . iopamidol (ISOVUE-300) 61 % injection           . iopamidol (ISOVUE-370) 76 % injection            Current Outpatient Medications  Medication Sig Dispense Refill  . acetaminophen (TYLENOL) 500 MG tablet Take 1,000 mg by mouth every 6 (six) hours as needed for headache.    [place] Kitchen amoxicillin (AMOXIL) 500 MG capsule Take 500 mg by mouth 2 (two) times daily.    Marland Kitchen  calcium carbonate (OS-CAL) 600 MG TABS tablet Take 600 mg by mouth 2 (two) times daily with a meal.    . cholecalciferol (VITAMIN D) 1000 UNITS tablet Take 1,000 Units by mouth every morning.     Marland Kitchen HYDROcodone-acetaminophen (NORCO/VICODIN) 5-325 MG tablet Take 1-2 tablets by mouth every 6 (six) hours as needed for moderate pain. 15 tablet 0  . levothyroxine (SYNTHROID, LEVOTHROID) 88 MCG tablet Take 88 mcg by mouth daily before breakfast.    . Polyethyl Glycol-Propyl Glycol (SYSTANE)  0.4-0.3 % SOLN Place 1 drop into both eyes daily.    . risedronate (ACTONEL) 150 MG tablet Take 150 mg by mouth every 30 (thirty) days.  3     No Known Allergies  REVIEW OF SYSTEMS: Skin:  No history of rash.  No history of abnormal moles. Infection:  No history of hepatitis or HIV.  No history of MRSA. Neurologic:  No history of stroke.  No history of seizure.  Blind left eye from injury Cardiac:  No history of hypertension. No history of heart disease.  No history of seeing a cardiologist. Pulmonary:  Does not smoke cigarettes.  No asthma or bronchitis.  No OSA/CPAP.  Endocrine:  No diabetes. On thyroid replacement since age 52. Gastrointestinal:  See HPI Urologic:  No history of kidney stones.  No history of bladder infections. Musculoskeletal:  Broke wrist - was on NSAIDs, but has stopped them for months. Hematologic:  No bleeding disorder.  No history of anemia.  Not anticoagulated. Psycho-social:  The patient is oriented.   The patient has no obvious psychologic or social impairment to understanding our conversation and plan.  SOCIAL and FAMILY HISTORY: Divorced. Granddaughter lives with her.  Her mother, Enid Cutter, is a rad Designer, multimedia at [employer]. She has 2 daughters and 1 son. She works as a Education officer, museum. Her ex husband, Erian Lariviere, is a retired Radiographer, therapeutic.  His phone: [phone].  I spoke to him on the phone and reviewed her findings.  He lives in [place].  Family history of ulcer disease.  Her mother required surgery for ulcer disease.  PHYSICAL EXAM: BP (!) 155/76   Pulse 77   Temp 97.8 F (36.6 C) (Oral)   Resp 16   SpO2 96%   General: WN WF who is alert. Skin:  Inspection and palpation - no mass or rash. Eyes:  Conjunctiva and lids unremarkable.            Pupils are equal Ears, Nose, Mouth, and Throat:  Ears and nose unremarkable            Lips and teeth are unremarable. Neck: Supple. No mass, trachea midline.  No thyroid mass. Lymph Nodes:  No  supraclavicular, cervical, or inguinal nodes.  Lungs: Normal respiratory effort.  Clear to auscultation and symmetric breath sounds. Heart:  Palpation of the heart is normal.            Auscultation: RRR. No murmur or rub.  Abdomen: Soft. No mass. No hernia.             Has bowel sounds.    Epigastric tenderness.  Incisions look good.  Rectal: Not done. Musculoskeletal:  Good muscle strength and ROM  in upper and lower extremities. Neurologic:  Grossly intact to motor and sensory function. Psychiatric: Normal judgement and insight. Behavior is normal.            Oriented to time, person, place.   DATA REVIEWED, COUNSELING AND COORDINATION OF CARE: Epic notes  reviewed. Counseling and coordination of care exceeded more than 50% of the time spent with patient. Total time spent with patient and charting: 45 minutes  Alphonsa Overall, MD,  [facility], [address]  [place], [place]    [place] Phone:  [phone] FAX:  [phone]

## 2017-07-21 NOTE — Progress Notes (Addendum)
Pt having c/of polyuria and is  hypertensive-SBP (170-180).  RN notified [facility] on call provider. Verbal orders given to change iv maintenance fluids from 150 ml/ hr to 100 ml/ hr. RN Will continue to monitor.

## 2017-07-21 NOTE — ED Provider Notes (Addendum)
Pt handed off to me by previous EDPA at shift change pending CT AP. Anticipate admission. Please see previous EDPA note for full H&P.   Briefly, pt is a 67 yo F with post op abdominal pain after appendectomy for perforated appendicitis on 5/21.  Discharged on on 5/24.  Pt reports increased abdominal pain, light-headedness, generalized weakness and presyncope with standing up. Hypotensive by EMS.  Physical Exam  BP 131/81   Pulse 85   Temp 97.8 F (36.6 C) (Oral)   Resp 16   SpO2 97%   Physical Exam  Constitutional: She is oriented to person, place, and time. She appears well-developed and well-nourished. No distress.  Non toxic  HENT:  Head: Normocephalic and atraumatic.  Nose: Nose normal.  Moist mucous membranes   Eyes: Pupils are equal, round, and reactive to light. Conjunctivae and EOM are normal.  Neck: Normal range of motion.  Cardiovascular: Normal rate and regular rhythm.  Pulmonary/Chest: Effort normal and breath sounds normal.  Abdominal: Soft. Bowel sounds are normal. There is tenderness.  Epigastric abdominal tenderness with guarding. No rebound or rigidity. No distention. No abdominal or flank ecchymosis. No suprapubic or CVA tenderness. Negative Murphy's and McBurney's.   Musculoskeletal: Normal range of motion.  Neurological: She is alert and oriented to person, place, and time.  Skin: Skin is warm and dry. Capillary refill takes less than 2 seconds.  Psychiatric: She has a normal mood and affect. Her behavior is normal.  Nursing note and vitals reviewed.   ED Course/Procedures   Clinical Course as of Jul 22 735  Sun Jul 21, 2017  0104 IMPRESSION: 1. Large retroperitoneal hematoma, predominantly within the anterior pararenal space, surrounding the head and uncinate process of the pancreas and the duodenum. No clear source of the hematoma is identified. This is remote from the right lower quadrant appendectomy site. The etiology of the hemorrhage is  unclear. Possibilities include hemorrhagic pancreatitis, coagulopathic hemorrhage or bleeding from a perforated duodenal ulcer (less likely). Recommend correlation with coagulation studies. If that is normal, CTA of the abdomen/pelvis might be helpful. 2. Small fluid and gas collection directly posterior to the third portion of the duodenum, concerning for duodenal perforation. 3. Small volume free fluid in the right lower quadrant and pelvis, compatible with recent appendectomy. No abscess in this location.  These results were called by telephone at the time of interpretation on 07/21/2017 at 12:23 am to Carmon Sails, [place], who verbally acknowledged these results.  CT Abdomen Pelvis W Contrast [CG]  0104 WBC(!): 14.4 [CG]  0105 Hemoglobin: 12.2 [CG]  0105 Pulse Rate: 83 [CG]  0105 BP(!): 155/90 [CG]  0105 SpO2: 98 % [CG]  0105 MAP (mmHg): 105 [CG]  0238 Hemoglobin(!): 10.5 [CG]  0238 HCT(!): 31.3 [CG]  0337 Fibrinogen(!): 730 [CG]  0637 IMPRESSION: VASCULAR  Aorta and major branch vessels are normal. No evidence of aneurysm or dissection. No contrast extravasation to suggest active bleeding.  NON-VASCULAR  Large retroperitoneal hematoma is again demonstrated. Small amount of free fluid throughout the abdomen and pelvis consistent with hemorrhagic fluid also again demonstrated. Small gas and fluid collection in the retroperitoneum adjacent to the duodenum is also unchanged. Small bilateral pleural effusions with basilar atelectasis. Postoperative changes consistent with recent appendectomy. No abscess identified.    CT Angio Abd/Pel w/ and/or w/o [CG]  0735 Hemoglobin(!): 8.8 [CG]    Clinical Course User Index [CG] Kinnie Feil, PA-C    Procedures  MDM   0025: Radiologist called regarding results  of large retroperitoneal hematoma, recommending coag panel. Consider CTA AP to find source. Coag panel ordered. VSS.  0100: Re-evaluated patient and discussed  results and plan to admit. VSS. Improving abdominal tenderness. Spoke to Dr. Lucia Gaskins who recommends admission, in agreement with coag panel +/- CTA.  Surgery team will see patient in AM.   0200: Delay in blood draw, awaiting IV team consult for coag panel. Consult hospitalist.   [id number]: Spoke to Dr Tamala Julian (hospitalist) who will defer admission at this time, pending ER work up.  Hgb 12.2 > 10.5 in the last 4 hours, remaining coag panel pending. CTA ordered. Pt on cardiac monitor. T&S sent.   6378: CTA without source of bleed. H/H re-ordered. Spoke to Dr. Tamala Julian who is deferring admission to general surgery. Pending general surgery consult.   0730: Spoke to Dr. Lucia Gaskins who is in ER to see patient, accepts patient into his service. Hgb 12.2 > 10.5 > 8.8. VSS.     Kinnie Feil, PA-C 07/21/17 5885    Milton Ferguson, MD 07/21/17 2340

## 2017-07-21 NOTE — ED Notes (Signed)
ED TO INPATIENT HANDOFF REPORT  Name/Age/Gender Kristin Gentry 67 y.o. female  Code Status Code Status History    Date Active Date Inactive Code Status Order ID Comments User Context   07/16/2017 1824 07/20/2017 1438 Full Code [id number]  Armandina Gemma, MD ED      Home/SNF/Other Home  Chief Complaint post-op issues; near syncope; dizziness and nausea  Level of Care/Admitting Diagnosis ED Disposition    ED Disposition Condition [facility] Area: [facility] [[id number]]  Level of Care: Stepdown [14]  Admit to SDU based on following criteria: Other see comments  Comments: has retroperitneal hemorrhage  Diagnosis: Retroperitoneal hemorrhage [952841]  Admitting Physician: CCS, [employer]  Attending Physician: CCS, MD [[id number]]  Estimated length of stay: 5 - 7 days  Certification:: I certify this patient will need inpatient services for at least 2 midnights  PT Class (Do Not Modify): Inpatient [101]  PT Acc Code (Do Not Modify): Private [1]       Medical History Past Medical History:  Diagnosis Date  . Abnormal Pap smear of cervix 1990  . Arthritis    left knee  . 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 Cancer ([place]) 1989   cervical  . GERD (gastroesophageal reflux disease)   . Hypothyroidism   . 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 Thyroid disease    hypothyroid    Allergies No Known Allergies  IV Location/Drains/Wounds Patient Lines/Drains/Airways Status   Active Line/Drains/Airways    Name:   Placement date:   Placement time:   Site:   Days:   Peripheral IV 07/20/17 Right Wrist   07/20/17    2019    Wrist   1   Peripheral IV 07/21/17 Right Antecubital   07/21/17    0520    Antecubital   less than 1   Incision (Closed) 07/16/17 Abdomen   07/16/17    2005     5   Incision - 3 Ports Abdomen Right;Upper Umbilicus Left;Lower   07/16/17    1951     5          Labs/Imaging Results for orders placed or performed during the hospital encounter  of 07/20/17 (from the past 48 hour(s))  Comprehensive metabolic panel     Status: Abnormal   Collection Time: 07/20/17 10:00 PM  Result Value Ref Range   Sodium 137 135 - 145 mmol/L   Potassium 3.4 (L) 3.5 - 5.1 mmol/L   Chloride 105 101 - 111 mmol/L   CO2 19 (L) 22 - 32 mmol/L   Glucose, Bld 131 (H) 65 - 99 mg/dL   BUN 8 6 - 20 mg/dL   Creatinine, Ser 0.67 0.44 - 1.00 mg/dL   Calcium 8.2 (L) 8.9 - 10.3 mg/dL   Total Protein 6.0 (L) 6.5 - 8.1 g/dL   Albumin 2.7 (L) 3.5 - 5.0 g/dL   AST 17 15 - 41 U/L   ALT 22 14 - 54 U/L   Alkaline Phosphatase 42 38 - 126 U/L   Total Bilirubin 0.4 0.3 - 1.2 mg/dL   GFR calc non Af Amer >60 >60 mL/min   GFR calc Af Amer >60 >60 mL/min    Comment: (NOTE) The eGFR has been calculated using the CKD EPI equation. This calculation has not been validated in all clinical situations. eGFR's persistently <60 mL/min signify possible Chronic Kidney Disease.    Anion gap 13 5 - 15    Comment:  Performed at [facility], [place] [address] [place], [place] [address]  Lipase, blood     Status: None   Collection Time: 07/20/17 10:00 PM  Result Value Ref Range   Lipase 27 11 - 51 U/L    Comment: Performed at [facility], [place] [address], [place], [place] [address]  CBC with Differential     Status: Abnormal   Collection Time: 07/20/17 10:00 PM  Result Value Ref Range   WBC 14.4 (H) 4.0 - 10.5 K/uL   RBC 3.64 (L) 3.87 - 5.11 MIL/uL   Hemoglobin 12.2 12.0 - 15.0 g/dL   HCT 36.5 36.0 - 46.0 %   MCV 100.3 (H) 78.0 - 100.0 fL   MCH 33.5 26.0 - 34.0 pg   MCHC 33.4 30.0 - 36.0 g/dL   RDW 12.4 11.5 - 15.5 %   Platelets 304 150 - 400 K/uL   Neutrophils Relative % 86 %   Lymphocytes Relative 5 %   Monocytes Relative 2 %   Eosinophils Relative 0 %   Basophils Relative 0 %   Band Neutrophils 1 %   Metamyelocytes Relative 4 %   Myelocytes 2 %   Neutro Abs 13.4 (H) 1.7 - 7.7 K/uL   Lymphs Abs 0.7 0.7 - 4.0 K/uL   Monocytes  Absolute 0.3 0.1 - 1.0 K/uL   Eosinophils Absolute 0.0 0.0 - 0.7 K/uL   Basophils Absolute 0.0 0.0 - 0.1 K/uL   WBC Morphology DOHLE BODIES     Comment: MILD LEFT SHIFT (1-5% METAS, OCC MYELO, OCC BANDS) Performed at [facility], [place] [address], [place], [place] [address]   Urinalysis, Routine w reflex microscopic     Status: Abnormal   Collection Time: 07/20/17 10:00 PM  Result Value Ref Range   Color, Urine YELLOW YELLOW   APPearance HAZY (A) CLEAR   Specific Gravity, Urine 1.019 1.005 - 1.030   pH 5.0 5.0 - 8.0   Glucose, UA NEGATIVE NEGATIVE mg/dL   Hgb urine dipstick NEGATIVE NEGATIVE   Bilirubin Urine NEGATIVE NEGATIVE   Ketones, ur 20 (A) NEGATIVE mg/dL   Protein, ur 30 (A) NEGATIVE mg/dL   Nitrite NEGATIVE NEGATIVE   Leukocytes, UA NEGATIVE NEGATIVE   RBC / HPF 0-5 0 - 5 RBC/hpf   WBC, UA 0-5 0 - 5 WBC/hpf   Bacteria, UA NONE SEEN NONE SEEN   Squamous Epithelial / LPF 0-5 0 - 5   Mucus PRESENT    Hyaline Casts, UA PRESENT     Comment: Performed at [facility], [place] [address], [place], [place] [address]  I-stat Chem 8, ED     Status: Abnormal   Collection Time: 07/20/17 10:08 PM  Result Value Ref Range   Sodium 138 135 - 145 mmol/L   Potassium 3.4 (L) 3.5 - 5.1 mmol/L   Chloride 104 101 - 111 mmol/L   BUN 6 6 - 20 mg/dL   Creatinine, Ser 0.60 0.44 - 1.00 mg/dL   Glucose, Bld 126 (H) 65 - 99 mg/dL   Calcium, Ion 1.10 (L) 1.15 - 1.40 mmol/L   TCO2 20 (L) 22 - 32 mmol/L   Hemoglobin 12.6 12.0 - 15.0 g/dL   HCT 37.0 36.0 - 46.0 %  Type and screen [employer]     Status: None   Collection Time: 07/21/17  2:07 AM  Result Value Ref Range   ABO/RH(D) A POS    Antibody Screen NEG  Sample Expiration      07/24/2017 Performed at [facility], [place] [address], [place], [place] [address]   ABO/Rh     Status: None   Collection Time: 07/21/17  2:07 AM  Result Value Ref Range   ABO/RH(D)      A  POS Performed at [facility], [place] [address], [place], [place] [address]   Fibrinogen (coagulopathy lab panel)     Status: Abnormal   Collection Time: 07/21/17  2:17 AM  Result Value Ref Range   Fibrinogen 730 (H) 210 - 475 mg/dL    Comment: Performed at [facility], [place] [address] [place], [place] [address]  Platelet count (coagulopathy lab panel)     Status: None   Collection Time: 07/21/17  2:17 AM  Result Value Ref Range   Platelets 302 150 - 400 K/uL    Comment: Performed at [facility], [place] [address], [place], [place] [address]  Hemoglobin and hematocrit, blood (coagulopathy lab panel)     Status: Abnormal   Collection Time: 07/21/17  2:17 AM  Result Value Ref Range   Hemoglobin 10.5 (L) 12.0 - 15.0 g/dL   HCT 31.3 (L) 36.0 - 46.0 %    Comment: Performed at [facility], [place] [address] [place], [place] [address]  Protime-INR (coagulopathy lab panel)     Status: None   Collection Time: 07/21/17  2:17 AM  Result Value Ref Range   Prothrombin Time 13.9 11.4 - 15.2 seconds   INR 1.08     Comment: Performed at [facility], [place] [address], [place], [place] [address]  APTT (coagulopathy lab panel)     Status: None   Collection Time: 07/21/17  2:17 AM  Result Value Ref Range   aPTT 29 24 - 36 seconds    Comment: Performed at [facility], [place] [address] [place], [place] [address]  Hemoglobin and hematocrit, blood     Status: Abnormal   Collection Time: 07/21/17  6:00 AM  Result Value Ref Range   Hemoglobin 8.8 (L) 12.0 - 15.0 g/dL   HCT 26.0 (L) 36.0 - 46.0 %    Comment: Performed at [facility], [place] [address] [place],  [address]   Ct Abdomen Pelvis W Contrast  Result Date: 07/21/2017 CLINICAL DATA:  Recent appendectomy.  Abdominal pain. EXAM: CT ABDOMEN AND PELVIS WITH CONTRAST TECHNIQUE: Multidetector CT imaging of the abdomen  and pelvis was performed using the standard protocol following bolus administration of intravenous contrast. CONTRAST:  117m ISOVUE-300 IOPAMIDOL (ISOVUE-300) INJECTION 61% COMPARISON:  07/16/2017 FINDINGS: LOWER CHEST: Small pleural effusions with associated atelectasis. HEPATOBILIARY: Normal hepatic contours and density. No intra- or extrahepatic biliary dilatation. Small hepatic cyst is unchanged. Normal gallbladder. PANCREAS: There is a large amount of hyperdense material surrounding the pancreatic head and uncinate process and extending inferiorly within the anterior pararenal space. The density is most consistent with blood. The blood surrounds the duodenum. The extent of the collection measures approximately 5.2 x 12.8 x 13.4 cm (AP x Transverse x CC). A source of hemorrhage is not identified. SPLEEN: Small amount of fluid adjacent to the spleen. ADRENALS/URINARY TRACT: --Adrenal glands: Normal. --Right kidney/ureter: No hydronephrosis, nephroureterolithiasis, perinephric stranding or solid renal mass. --Left kidney/ureter: No hydronephrosis, nephroureterolithiasis, perinephric stranding or solid renal mass. --Urinary bladder: Normal for degree of distention STOMACH/BOWEL: --Stomach/Duodenum: Duodenum is encased by the above-described hematoma. Arising from the midportion of the third  segment of the duodenum, there is a superiorly extending fluid and gas collection that measures 5.6 x 0.8 cm (6:63). --Small bowel: No dilatation or inflammation. --Colon: No focal abnormality. --Appendix: Recent appendectomy. Small amount of fluid in the right lower quadrant. VASCULAR/LYMPHATIC: There is flattening of the IVC, consistent with low volume status. There is atherosclerotic calcification of the non aneurysmal abdominal aorta. No abdominal or pelvic lymphadenopathy. REPRODUCTIVE: Status post hysterectomy. No adnexal mass. MUSCULOSKELETAL. No bony spinal canal stenosis or focal osseous abnormality. OTHER: None.  IMPRESSION: 1. Large retroperitoneal hematoma, predominantly within the anterior pararenal space, surrounding the head and uncinate process of the pancreas and the duodenum. No clear source of the hematoma is identified. This is remote from the right lower quadrant appendectomy site. The etiology of the hemorrhage is unclear. Possibilities include hemorrhagic pancreatitis, coagulopathic hemorrhage or bleeding from a perforated duodenal ulcer (less likely). Recommend correlation with coagulation studies. If that is normal, CTA of the abdomen/pelvis might be helpful. 2. Small fluid and gas collection directly posterior to the third portion of the duodenum, concerning for duodenal perforation. 3. Small volume free fluid in the right lower quadrant and pelvis, compatible with recent appendectomy. No abscess in this location. These results were called by telephone at the time of interpretation on 07/21/2017 at 12:23 am to Carmon Sails, [place], who verbally acknowledged these results. Electronically Signed   By: Ulyses Jarred M.D.   On: 07/21/2017 00:23   Ct Angio Abd/pel W/ And/or W/o  Result Date: 07/21/2017 CLINICAL DATA:  Retroperitoneal hematoma demonstrated on previous CT abdomen and pelvis. EXAM: CTA ABDOMEN AND PELVIS wITHOUT AND WITH CONTRAST TECHNIQUE: Multidetector CT imaging of the abdomen and pelvis was performed using the standard protocol during bolus administration of intravenous contrast. Multiplanar reconstructed images and MIPs were obtained and reviewed to evaluate the vascular anatomy. CONTRAST:  57m ISOVUE-370 IOPAMIDOL (ISOVUE-370) INJECTION 76% COMPARISON:  CT abdomen and pelvis 07/20/2017 FINDINGS: VASCULAR Aorta: Normal caliber aorta without aneurysm, dissection, vasculitis or significant stenosis. Celiac: Patent without evidence of aneurysm, dissection, vasculitis or significant stenosis. SMA: Patent without evidence of aneurysm, dissection, vasculitis or significant stenosis. Renals:  Duplicated renal arteries bilaterally appear patent. No evidence of aneurysm or dissection. IMA: Patent without evidence of aneurysm, dissection, vasculitis or significant stenosis. Inflow: Patent without evidence of aneurysm, dissection, vasculitis or significant stenosis. Proximal Outflow: Bilateral common femoral and visualized portions of the superficial and profunda femoral arteries are patent without evidence of aneurysm, dissection, vasculitis or significant stenosis. Veins: No obvious venous abnormality within the limitations of this arterial phase study. Review of the MIP images confirms the above findings. NON-VASCULAR Lower chest: Small bilateral pleural effusions with basilar atelectasis. Bilateral breast implants. Hepatobiliary: Several scattered low-attenuation lesions in the liver. These are better seen on the previous contrast-enhanced CT and are consistent with cysts. Gallbladder and bile ducts are unremarkable. Pancreas: Unremarkable. No pancreatic ductal dilatation or surrounding inflammatory changes. Spleen: Normal in size without focal abnormality. Adrenals/Urinary Tract: Adrenal glands are unremarkable. Kidneys are normal, without renal calculi, focal lesion, or hydronephrosis. Bladder is unremarkable. Stomach/Bowel: Stomach, small bowel, and colon are not abnormally distended. No wall thickening or inflammatory changes appreciated. Surgical absence of the appendix with surgical clips present. Infiltration and fluid in the right lower quadrant similar to previous study. This is likely postoperative and could reflect a component of residual inflammatory change. No abscess identified. Lymphatic: No significant lymphadenopathy. Reproductive: Status post hysterectomy. No adnexal masses. Other: There is free fluid in the pelvis and  along the pericolic gutters extending up around the liver and spleen. Fluid has higher density consistent with hemorrhagic fluid. Similar appearance to previous study.  There is a large retroperitoneal hematoma again demonstrated, centered towards the right and surrounding the pancreas and duodenum. No change in size. No contrast extravasation is identified. Etiology of the hemorrhage remains uncertain. Small loculated gas and fluid collection inferior to the duodenum is also unchanged since previous study. This could be postoperative although again, duodenal perforation is not excluded. Musculoskeletal: Degenerative changes in the spine. IMPRESSION: VASCULAR Aorta and major branch vessels are normal. No evidence of aneurysm or dissection. No contrast extravasation to suggest active bleeding. NON-VASCULAR Large retroperitoneal hematoma is again demonstrated. Small amount of free fluid throughout the abdomen and pelvis consistent with hemorrhagic fluid also again demonstrated. Small gas and fluid collection in the retroperitoneum adjacent to the duodenum is also unchanged. Small bilateral pleural effusions with basilar atelectasis. Postoperative changes consistent with recent appendectomy. No abscess identified. Electronically Signed   By: Lucienne Capers M.D.   On: 07/21/2017 06:24    Pending Labs [employer] (From admission, onward)   Start     Ordered   Signed and Held  HIV antibody (Routine Testing)  Once,   R     Signed and Held   Signed and Held  CBC  Once,   R     Signed and Held   Signed and Held  CBC  Tomorrow morning,   R     Signed and Held   Signed and Held  Comprehensive metabolic panel  Tomorrow morning,   R     Signed and Held      Vitals/Pain Today's Vitals   07/21/17 0630 07/21/17 0700 07/21/17 0701 07/21/17 0913  BP: 132/80 131/81 131/81 132/78  Pulse: 94 89 85 88  Resp:   16 16  Temp:      TempSrc:      SpO2: 98% 97% 97% 98%  PainSc:        Isolation Precautions No active isolations  Medications Medications  iopamidol (ISOVUE-300) 61 % injection (has no administration in time range)  iopamidol (ISOVUE-370) 76 % injection  (has no administration in time range)  pantoprazole (PROTONIX) injection 40 mg (40 mg Intravenous Given 07/21/17 0921)  morphine 4 MG/ML injection 4 mg (4 mg Intravenous Given 07/20/17 2127)  sodium chloride 0.9 % bolus 1,000 mL (0 mLs Intravenous Stopped 07/20/17 2247)  ondansetron (ZOFRAN) injection 4 mg (4 mg Intravenous Given 07/20/17 2127)  iopamidol (ISOVUE-300) 61 % injection 100 mL (100 mLs Intravenous Contrast Given 07/20/17 2326)  iopamidol (ISOVUE-370) 76 % injection 100 mL (75 mLs Intravenous Contrast Given 07/21/17 0534)  morphine 4 MG/ML injection 4 mg (4 mg Intravenous Given 07/21/17 0604)  ondansetron (ZOFRAN) injection 4 mg (4 mg Intravenous Given 07/21/17 0604)    Mobility walks

## 2017-07-22 LAB — CBC
HEMATOCRIT: 23.9 % — AB (ref 36.0–46.0)
Hemoglobin: 8.2 g/dL — ABNORMAL LOW (ref 12.0–15.0)
MCH: 33.5 pg (ref 26.0–34.0)
MCHC: 34.3 g/dL (ref 30.0–36.0)
MCV: 97.6 fL (ref 78.0–100.0)
PLATELETS: 301 10*3/uL (ref 150–400)
RBC: 2.45 MIL/uL — ABNORMAL LOW (ref 3.87–5.11)
RDW: 12.6 % (ref 11.5–15.5)
WBC: 8.9 10*3/uL (ref 4.0–10.5)

## 2017-07-22 LAB — COMPREHENSIVE METABOLIC PANEL
ALBUMIN: 2.2 g/dL — AB (ref 3.5–5.0)
ALK PHOS: 31 U/L — AB (ref 38–126)
ALT: 14 U/L (ref 14–54)
ANION GAP: 7 (ref 5–15)
AST: 12 U/L — ABNORMAL LOW (ref 15–41)
BUN: <5 mg/dL — ABNORMAL LOW (ref 6–20)
CALCIUM: 7.4 mg/dL — AB (ref 8.9–10.3)
CO2: 23 mmol/L (ref 22–32)
Chloride: 106 mmol/L (ref 101–111)
Creatinine, Ser: 0.51 mg/dL (ref 0.44–1.00)
GFR calc non Af Amer: >60 mL/min (ref >60)
Glucose, Bld: 137 mg/dL — ABNORMAL HIGH (ref 65–99)
POTASSIUM: 3.5 mmol/L (ref 3.5–5.1)
SODIUM: 136 mmol/L (ref 135–145)
Total Bilirubin: 0.4 mg/dL (ref 0.3–1.2)
Total Protein: 4.8 g/dL — ABNORMAL LOW (ref 6.5–8.1)

## 2017-07-22 LAB — HIV ANTIBODY (ROUTINE TESTING W REFLEX): HIV SCREEN 4TH GENERATION: NONREACTIVE

## 2017-07-22 NOTE — Progress Notes (Signed)
Patient ID: Kristin Gentry, female   DOB: 09/21/1950, 67 y.o.   MRN: [id number]     Subjective: She feels much better today.  Pain is more than 50% better, really just sore.  No nausea.  Objective: Vital signs in last 24 hours: Temp:  [97 F (36.1 C)-97.8 F (36.6 C)] 97 F (36.1 C) (05/27 0400) Pulse Rate:  [64-99] 64 (05/27 0400) Resp:  [13-25] 16 (05/27 0400) BP: (104-171)/(62-127) 151/66 (05/27 0400) SpO2:  [96 %-100 %] 99 % (05/27 0400) Weight:  [62.6 kg (138 lb 0.1 oz)] 62.6 kg (138 lb 0.1 oz) (05/26 0958) Last BM Date: 07/21/17  Intake/Output from previous day: 05/26 0701 - 05/27 0700 In: 1335 [P.O.:90; I.V.:1175; IV Piggyback:50] Out: 1100 [Urine:1100] Intake/Output this shift: No intake/output data recorded.  General appearance: alert, cooperative and no distress GI: normal findings: soft, non-tender and Nondistended  Lab Results:  Recent Labs    07/21/17 1654 07/22/17 0500  WBC 8.8 8.9  HGB 8.1* 8.2*  HCT 24.0* 23.9*  PLT 291 301   BMET Recent Labs    07/20/17 2200 07/20/17 2208 07/22/17 0500  NA 137 138 136  K 3.4* 3.4* 3.5  CL 105 104 106  CO2 19*  --  23  GLUCOSE 131* 126* 137*  BUN 8 6 <5*  CREATININE 0.67 0.60 0.51  CALCIUM 8.2*  --  7.4*     Studies/Results: Ct Abdomen Pelvis W Contrast  Result Date: 07/21/2017 CLINICAL DATA:  Recent appendectomy.  Abdominal pain. EXAM: CT ABDOMEN AND PELVIS WITH CONTRAST TECHNIQUE: Multidetector CT imaging of the abdomen and pelvis was performed using the standard protocol following bolus administration of intravenous contrast. CONTRAST:  185mL ISOVUE-300 IOPAMIDOL (ISOVUE-300) INJECTION 61% COMPARISON:  07/16/2017 FINDINGS: LOWER CHEST: Small pleural effusions with associated atelectasis. HEPATOBILIARY: Normal hepatic contours and density. No intra- or extrahepatic biliary dilatation. Small hepatic cyst is unchanged. Normal gallbladder. PANCREAS: There is a large amount of hyperdense material  surrounding the pancreatic head and uncinate process and extending inferiorly within the anterior pararenal space. The density is most consistent with blood. The blood surrounds the duodenum. The extent of the collection measures approximately 5.2 x 12.8 x 13.4 cm (AP x Transverse x CC). A source of hemorrhage is not identified. SPLEEN: Small amount of fluid adjacent to the spleen. ADRENALS/URINARY TRACT: --Adrenal glands: Normal. --Right kidney/ureter: No hydronephrosis, nephroureterolithiasis, perinephric stranding or solid renal mass. --Left kidney/ureter: No hydronephrosis, nephroureterolithiasis, perinephric stranding or solid renal mass. --Urinary bladder: Normal for degree of distention STOMACH/BOWEL: --Stomach/Duodenum: Duodenum is encased by the above-described hematoma. Arising from the midportion of the third segment of the duodenum, there is a superiorly extending fluid and gas collection that measures 5.6 x 0.8 cm (6:63). --Small bowel: No dilatation or inflammation. --Colon: No focal abnormality. --Appendix: Recent appendectomy. Small amount of fluid in the right lower quadrant. VASCULAR/LYMPHATIC: There is flattening of the IVC, consistent with low volume status. There is atherosclerotic calcification of the non aneurysmal abdominal aorta. No abdominal or pelvic lymphadenopathy. REPRODUCTIVE: Status post hysterectomy. No adnexal mass. MUSCULOSKELETAL. No bony spinal canal stenosis or focal osseous abnormality. OTHER: None. IMPRESSION: 1. Large retroperitoneal hematoma, predominantly within the anterior pararenal space, surrounding the head and uncinate process of the pancreas and the duodenum. No clear source of the hematoma is identified. This is remote from the right lower quadrant appendectomy site. The etiology of the hemorrhage is unclear. Possibilities include hemorrhagic pancreatitis, coagulopathic hemorrhage or bleeding from a perforated duodenal ulcer (less likely). Recommend correlation  with  coagulation studies. If that is normal, CTA of the abdomen/pelvis might be helpful. 2. Small fluid and gas collection directly posterior to the third portion of the duodenum, concerning for duodenal perforation. 3. Small volume free fluid in the right lower quadrant and pelvis, compatible with recent appendectomy. No abscess in this location. These results were called by telephone at the time of interpretation on 07/21/2017 at 12:23 am to Carmon Sails, [place], who verbally acknowledged these results. Electronically Signed   By: Ulyses Jarred M.D.   On: 07/21/2017 00:23   [place] Ekg Site Rite  Result Date: 07/21/2017 If Site Rite image not attached, placement could not be confirmed due to current cardiac rhythm.  Ct Angio Abd/pel W/ And/or W/o  Result Date: 07/21/2017 CLINICAL DATA:  Retroperitoneal hematoma demonstrated on previous CT abdomen and pelvis. EXAM: CTA ABDOMEN AND PELVIS wITHOUT AND WITH CONTRAST TECHNIQUE: Multidetector CT imaging of the abdomen and pelvis was performed using the standard protocol during bolus administration of intravenous contrast. Multiplanar reconstructed images and MIPs were obtained and reviewed to evaluate the vascular anatomy. CONTRAST:  78mL ISOVUE-370 IOPAMIDOL (ISOVUE-370) INJECTION 76% COMPARISON:  CT abdomen and pelvis 07/20/2017 FINDINGS: VASCULAR Aorta: Normal caliber aorta without aneurysm, dissection, vasculitis or significant stenosis. Celiac: Patent without evidence of aneurysm, dissection, vasculitis or significant stenosis. SMA: Patent without evidence of aneurysm, dissection, vasculitis or significant stenosis. Renals: Duplicated renal arteries bilaterally appear patent. No evidence of aneurysm or dissection. IMA: Patent without evidence of aneurysm, dissection, vasculitis or significant stenosis. Inflow: Patent without evidence of aneurysm, dissection, vasculitis or significant stenosis. Proximal Outflow: Bilateral common femoral and visualized portions of the  superficial and profunda femoral arteries are patent without evidence of aneurysm, dissection, vasculitis or significant stenosis. Veins: No obvious venous abnormality within the limitations of this arterial phase study. Review of the MIP images confirms the above findings. NON-VASCULAR Lower chest: Small bilateral pleural effusions with basilar atelectasis. Bilateral breast implants. Hepatobiliary: Several scattered low-attenuation lesions in the liver. These are better seen on the previous contrast-enhanced CT and are consistent with cysts. Gallbladder and bile ducts are unremarkable. Pancreas: Unremarkable. No pancreatic ductal dilatation or surrounding inflammatory changes. Spleen: Normal in size without focal abnormality. Adrenals/Urinary Tract: Adrenal glands are unremarkable. Kidneys are normal, without renal calculi, focal lesion, or hydronephrosis. Bladder is unremarkable. Stomach/Bowel: Stomach, small bowel, and colon are not abnormally distended. No wall thickening or inflammatory changes appreciated. Surgical absence of the appendix with surgical clips present. Infiltration and fluid in the right lower quadrant similar to previous study. This is likely postoperative and could reflect a component of residual inflammatory change. No abscess identified. Lymphatic: No significant lymphadenopathy. Reproductive: Status post hysterectomy. No adnexal masses. Other: There is free fluid in the pelvis and along the pericolic gutters extending up around the liver and spleen. Fluid has higher density consistent with hemorrhagic fluid. Similar appearance to previous study. There is a large retroperitoneal hematoma again demonstrated, centered towards the right and surrounding the pancreas and duodenum. No change in size. No contrast extravasation is identified. Etiology of the hemorrhage remains uncertain. Small loculated gas and fluid collection inferior to the duodenum is also unchanged since previous study. This  could be postoperative although again, duodenal perforation is not excluded. Musculoskeletal: Degenerative changes in the spine. IMPRESSION: VASCULAR Aorta and major branch vessels are normal. No evidence of aneurysm or dissection. No contrast extravasation to suggest active bleeding. NON-VASCULAR Large retroperitoneal hematoma is again demonstrated. Small amount of free fluid throughout the abdomen  and pelvis consistent with hemorrhagic fluid also again demonstrated. Small gas and fluid collection in the retroperitoneum adjacent to the duodenum is also unchanged. Small bilateral pleural effusions with basilar atelectasis. Postoperative changes consistent with recent appendectomy. No abscess identified. Electronically Signed   By: Lucienne Capers M.D.   On: 07/21/2017 06:24    Anti-infectives: Anti-infectives (From admission, onward)   Start     Dose/Rate Route Frequency Ordered Stop   07/21/17 1100  piperacillin-tazobactam (ZOSYN) IVPB 3.375 g     3.375 g 12.5 mL/hr over 240 Minutes Intravenous Every 8 hours 07/21/17 1017        Assessment/Plan: Retroperitoneal hematoma with recent history of laparoscopic appendectomy.  Patient clinically much improved today.  Hemoglobin decreased from admission but stable at last draw.  No evidence of active bleeding.  Also small amount of air in retroperitoneum adjacent to duodenum.  Questionable significance as she really does not have signs and symptoms of a perforation. Transfer to floor today.  Monitor hemoglobin.  Will consider Gastrografin swallow possibly tomorrow prior to starting diet.    LOS: 1 day    Edward Jolly 07/22/2017

## 2017-07-22 NOTE — Progress Notes (Signed)
SBAR REPORT GIVEN TO Marylin Crosby, RN WITH ALLOWANCE FOR ALL QUESTIONS TO BE ASKED AND ANSWERED. PATIENT DENIES PAIN OR NEEDS AT THIS TIME; NO DISTRESS NOTED.

## 2017-07-23 ENCOUNTER — Inpatient Hospital Stay (HOSPITAL_COMMUNITY): Payer: Medicare HMO

## 2017-07-23 LAB — CBC
HEMATOCRIT: 24.4 % — AB (ref 36.0–46.0)
Hemoglobin: 8 g/dL — ABNORMAL LOW (ref 12.0–15.0)
MCH: 32.7 pg (ref 26.0–34.0)
MCHC: 32.8 g/dL (ref 30.0–36.0)
MCV: 99.6 fL (ref 78.0–100.0)
Platelets: 331 10*3/uL (ref 150–400)
RBC: 2.45 MIL/uL — ABNORMAL LOW (ref 3.87–5.11)
RDW: 12.6 % (ref 11.5–15.5)
WBC: 8.2 10*3/uL (ref 4.0–10.5)

## 2017-07-23 MED ORDER — OXYCODONE HCL 5 MG PO TABS
5.0000 mg | ORAL_TABLET | ORAL | Status: DC | PRN
  Administered 2017-07-23 – 2017-07-25 (×7): 5 mg via ORAL
  Filled 2017-07-23 (×8): qty 1

## 2017-07-23 MED ORDER — IOPAMIDOL (ISOVUE-300) INJECTION 61%
150.0000 mL | Freq: Once | INTRAVENOUS | Status: AC | PRN
  Administered 2017-07-23: 150 mL via ORAL

## 2017-07-23 MED ORDER — IOPAMIDOL (ISOVUE-300) INJECTION 61%
INTRAVENOUS | Status: AC
  Filled 2017-07-23: qty 150

## 2017-07-23 MED ORDER — ACETAMINOPHEN 500 MG PO TABS
1000.0000 mg | ORAL_TABLET | Freq: Three times a day (TID) | ORAL | Status: DC
  Administered 2017-07-23: 1000 mg via ORAL
  Filled 2017-07-23 (×3): qty 2

## 2017-07-23 NOTE — Progress Notes (Signed)
Patient ID: Kristin Gentry, female   DOB: 08-22-1950, 67 y.o.   MRN: [id number]       Subjective: Patient feels ok, but had 6/10 pain in her back this morning.  Sitting up in a chair she is feeling better and after some pain medications.  No nausea.  Objective: Vital signs in last 24 hours: Temp:  [97.5 F (36.4 C)-99 F (37.2 C)] 99 F (37.2 C) (05/28 7616) Pulse Rate:  [77-80] 79 (05/28 0613) Resp:  [18] 18 (05/28 0613) BP: (142-147)/(77-85) 142/77 (05/28 0613) SpO2:  [98 %-100 %] 100 % (05/28 0613) Last BM Date: 07/20/17  Intake/Output from previous day: 05/27 0701 - 05/28 0700 In: 4654.2 [P.O.:290; I.V.:4164.2; IV Piggyback:200] Out: 3850 [Urine:3850] Intake/Output this shift: Total I/O In: -  Out: 350 [Urine:350]  PE: Heart: regular Lungs: CTAB Abd: soft mild diffuse tenderness, but no peritoneal signs or guarding, +BS  Lab Results:  Recent Labs    07/22/17 0500 07/23/17 0359  WBC 8.9 8.2  HGB 8.2* 8.0*  HCT 23.9* 24.4*  PLT 301 331   BMET Recent Labs    07/20/17 2200 07/20/17 2208 07/22/17 0500  NA 137 138 136  K 3.4* 3.4* 3.5  CL 105 104 106  CO2 19*  --  23  GLUCOSE 131* 126* 137*  BUN 8 6 <5*  CREATININE 0.67 0.60 0.51  CALCIUM 8.2*  --  7.4*   PT/INR Recent Labs    07/21/17 0217  LABPROT 13.9  INR 1.08   CMP     Component Value Date/Time   NA 136 07/22/2017 0500   K 3.5 07/22/2017 0500   CL 106 07/22/2017 0500   CO2 23 07/22/2017 0500   GLUCOSE 137 (H) 07/22/2017 0500   BUN <5 (L) 07/22/2017 0500   CREATININE 0.51 07/22/2017 0500   CALCIUM 7.4 (L) 07/22/2017 0500   PROT 4.8 (L) 07/22/2017 0500   ALBUMIN 2.2 (L) 07/22/2017 0500   AST 12 (L) 07/22/2017 0500   ALT 14 07/22/2017 0500   ALKPHOS 31 (L) 07/22/2017 0500   BILITOT 0.4 07/22/2017 0500   GFRNONAA >60 07/22/2017 0500   GFRAA >60 07/22/2017 0500   Lipase     Component Value Date/Time   LIPASE 27 07/20/2017 2200       Studies/Results: No results  found.  Anti-infectives: Anti-infectives (From admission, onward)   Start     Dose/Rate Route Frequency Ordered Stop   07/21/17 1100  piperacillin-tazobactam (ZOSYN) IVPB 3.375 g     3.375 g 12.5 mL/hr over 240 Minutes Intravenous Every 8 hours 07/21/17 1017         Assessment/Plan Retroperitoneal hematoma, POD 7, s/p lap appy for perforated appendicitis -patient feels ok, but still having some epigastric and back pain.  In discussion with possible causes of CT findings and how to proceed, the patient would like to be more conservative, which I agree with, and proceed with an UGI to rule out duodenal perforation or leak. -cont NPO for now until imaging done -cont zosyn, today would be last day based on her initial surgery, but will continue these for now given duodenal issues.  ABL anemia -hgb stable at 8  FEN - NPO VTE - SCDs, hold chemical prophylaxis but can likely start soon as hgb is stable ID - zosyn   LOS: 2 days    Henreitta Cea , [facility] 07/23/2017, 11:37 AM Pager: [phone]

## 2017-07-24 MED ORDER — FERROUS SULFATE 325 (65 FE) MG PO TABS
325.0000 mg | ORAL_TABLET | Freq: Every day | ORAL | Status: DC
  Filled 2017-07-24: qty 1

## 2017-07-24 MED ORDER — SACCHAROMYCES BOULARDII 250 MG PO CAPS
250.0000 mg | ORAL_CAPSULE | Freq: Two times a day (BID) | ORAL | Status: DC
  Administered 2017-07-24: 250 mg via ORAL
  Filled 2017-07-24: qty 1

## 2017-07-24 MED ORDER — PSYLLIUM 95 % PO PACK
1.0000 | PACK | Freq: Every day | ORAL | Status: DC
  Administered 2017-07-24: 1 via ORAL
  Filled 2017-07-24: qty 1

## 2017-07-24 MED ORDER — DOCUSATE SODIUM 100 MG PO CAPS
100.0000 mg | ORAL_CAPSULE | Freq: Two times a day (BID) | ORAL | Status: DC
  Administered 2017-07-24: 100 mg via ORAL
  Filled 2017-07-24: qty 1

## 2017-07-24 MED ORDER — ENOXAPARIN SODIUM 40 MG/0.4ML ~~LOC~~ SOLN
40.0000 mg | SUBCUTANEOUS | Status: DC
  Administered 2017-07-24 – 2017-07-25 (×2): 40 mg via SUBCUTANEOUS
  Filled 2017-07-24 (×2): qty 0.4

## 2017-07-24 MED ORDER — PANTOPRAZOLE SODIUM 40 MG PO TBEC
40.0000 mg | DELAYED_RELEASE_TABLET | Freq: Two times a day (BID) | ORAL | Status: DC
  Administered 2017-07-24 – 2017-07-25 (×2): 40 mg via ORAL
  Filled 2017-07-24 (×2): qty 1

## 2017-07-24 MED ORDER — POLYETHYLENE GLYCOL 3350 17 G PO PACK: 17.0000 g | PACK | Freq: Two times a day (BID) | ORAL | Status: DC | PRN

## 2017-07-24 MED ORDER — LEVOTHYROXINE SODIUM 88 MCG PO TABS
88.0000 ug | ORAL_TABLET | Freq: Every day | ORAL | Status: DC
  Administered 2017-07-25: 88 ug via ORAL
  Filled 2017-07-24: qty 1

## 2017-07-24 NOTE — Progress Notes (Signed)
Patient ID: Kristin Gentry, female   DOB: [birth year], 67 y.o.   MRN: [id number]       Subjective: Pt feels better today.  Tolerated pureed diet well.  No increase in pain or nausea.  Objective: Vital signs in last 24 hours: Temp:  [98.3 F (36.8 C)-99.8 F (37.7 C)] 99.8 F (37.7 C) (05/29 0937) Pulse Rate:  [66-96] 96 (05/29 0937) Resp:  [13-18] 16 (05/29 0937) BP: (131-150)/(75-78) 143/78 (05/29 0937) SpO2:  [61 %-100 %] 98 % (05/29 0937) Last BM Date: 07/23/17  Intake/Output from previous day: 05/28 0701 - 05/29 0700 In: 2188.3 [P.O.:330; I.V.:1708.3; IV Piggyback:150] Out: 2350 [Urine:2350] Intake/Output this shift: Total I/O In: -  Out: 500 [Urine:500]  PE: Heart: regular Lungs: CTAB Abd: soft, still some tenderness in epigastrium, but expected, +BS, incisions are c/d/i  Lab Results:  Recent Labs    07/22/17 0500 07/23/17 0359  WBC 8.9 8.2  HGB 8.2* 8.0*  HCT 23.9* 24.4*  PLT 301 331   BMET Recent Labs    07/22/17 0500  NA 136  K 3.5  CL 106  CO2 23  GLUCOSE 137*  BUN <5*  CREATININE 0.51  CALCIUM 7.4*   PT/INR No results for input(s): LABPROT, INR in the last 72 hours. CMP     Component Value Date/Time   NA 136 07/22/2017 0500   K 3.5 07/22/2017 0500   CL 106 07/22/2017 0500   CO2 23 07/22/2017 0500   GLUCOSE 137 (H) 07/22/2017 0500   BUN <5 (L) 07/22/2017 0500   CREATININE 0.51 07/22/2017 0500   CALCIUM 7.4 (L) 07/22/2017 0500   PROT 4.8 (L) 07/22/2017 0500   ALBUMIN 2.2 (L) 07/22/2017 0500   AST 12 (L) 07/22/2017 0500   ALT 14 07/22/2017 0500   ALKPHOS 31 (L) 07/22/2017 0500   BILITOT 0.4 07/22/2017 0500   GFRNONAA >60 07/22/2017 0500   GFRAA >60 07/22/2017 0500   Lipase     Component Value Date/Time   LIPASE 27 07/20/2017 2200       Studies/Results: Dg Ugi W/water Sol Cm  Result Date: 07/23/2017 CLINICAL DATA:  67 year old female 7 days post appendectomy. Development of retroperitoneal hemorrhage. Question  duodenal perforation. Subsequent encounter. EXAM: WATER SOLUBLE UPPER GI SERIES TECHNIQUE: Single-column upper GI series was performed using water soluble contrast. CONTRAST:  134mL ISOVUE-300 IOPAMIDOL (ISOVUE-300). COMPARISON:  07/20/2017 and 07/16/2017 CT. FLUOROSCOPY TIME:  Fluoroscopy Time:  2 minutes and 42 seconds. Radiation Exposure Index: 48.8 mGy. FINDINGS: Scout view reveals mild scoliosis lumbar spine convex left. Normal esophageal motility. No gastric mass, ulceration or leak noted on this single contrast exam. No duodenal bulb ulceration. Outpouching along the superior aspect of the horizontal segment of the duodenum has an appearance more suggestive of a diverticulum than sealed off ulcer. Mass-effect upon the distal portion of the horizontal segment of the duodenum may be related to mass effect from retroperitoneal hemorrhage. IMPRESSION: Outpouching along the superior aspect of the horizontal segment of the duodenum has an appearance more suggestive of a diverticulum than sealed off ulcer. 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 Otherwise no evidence of extraluminal contrast to suggest gastric or duodenal perforation. Mass-effect upon the distal portion of the horizontal segment of the duodenum may be related to mass effect from retroperitoneal hemorrhage. Electronically Signed   By: Genia Del M.D.   On: 07/23/2017 13:50    Anti-infectives: Anti-infectives (From admission, onward)   Start  Dose/Rate Route Frequency Ordered Stop   07/21/17 1100  piperacillin-tazobactam (ZOSYN) IVPB 3.375 g     3.375 g 12.5 mL/hr over 240 Minutes Intravenous Every 8 hours 07/21/17 1017         Assessment/Plan Retroperitoneal hematoma, POD 8, s/p lap appy for perforated appendicitis -patient feels better today.  UGI negative for leak, but did show some mild narrowing secondary to compression from hematoma.  Will advance  to soft diet and discussed need for low fiber and well chewed food until the hematoma begins to improve. -hgb stable, add lovenox for DVT prophylaxis -if tolerates diet today, hopefully home tomorrow -discussed hematomas take time to reabsorb and having some epigastric discomfort may be her normal for a little while, but certainly if at any point her pain significantly worsens again, she will need to be aware and call or come back to the ED -SLIV  ABL anemia -hgb stable at 8 -add iron supplement -add colace  FEN - soft diet VTE - SCDs, Lovenox ID - zosyn - ? Can this be stopped.  She has a normal WBC and relatively afebrile.  She has completed her 7 days of post operative abx for her appendix.    LOS: 3 days    Henreitta Cea , [facility] 07/24/2017, 9:57 AM Pager: [phone]

## 2017-07-24 NOTE — Care Management Important Message (Signed)
Important Message  Patient Details  Name: LILIAUNA SANTONI MRN: [id number] Date of Birth: [birth year]   Medicare Important Message Given:  Yes    Kerin Salen 07/24/2017, 11:44 AMImportant Message  Patient Details  Name: EDDITH MENTOR MRN: [id number] Date of Birth: 04-Apr-1950   Medicare Important Message Given:  Yes    Kerin Salen 07/24/2017, 11:44 AM

## 2017-07-24 NOTE — Plan of Care (Signed)
Plan of care discussed with patient 

## 2017-07-25 LAB — CBC
HCT: 23.7 % — ABNORMAL LOW (ref 36.0–46.0)
Hemoglobin: 7.9 g/dL — ABNORMAL LOW (ref 12.0–15.0)
MCH: 33.2 pg (ref 26.0–34.0)
MCHC: 33.3 g/dL (ref 30.0–36.0)
MCV: 99.6 fL (ref 78.0–100.0)
PLATELETS: 481 10*3/uL — AB (ref 150–400)
RBC: 2.38 MIL/uL — ABNORMAL LOW (ref 3.87–5.11)
RDW: 13.1 % (ref 11.5–15.5)
WBC: 9.2 10*3/uL (ref 4.0–10.5)

## 2017-07-25 MED ORDER — FERROUS SULFATE 325 (65 FE) MG PO TABS: 325.0000 mg | ORAL_TABLET | Freq: Every day | ORAL | 3 refills | Status: DC

## 2017-07-25 MED ORDER — DOCUSATE SODIUM 100 MG PO CAPS
100.0000 mg | ORAL_CAPSULE | Freq: Two times a day (BID) | ORAL | Status: DC
  Administered 2017-07-25: 100 mg via ORAL
  Filled 2017-07-25: qty 1

## 2017-07-25 MED ORDER — DOCUSATE SODIUM 100 MG PO CAPS: 100.0000 mg | ORAL_CAPSULE | Freq: Every day | ORAL | 2 refills | Status: AC | PRN

## 2017-07-25 NOTE — Progress Notes (Signed)
Assessment unchanged. Pt verbalized understanding of dc instructions through teach back including follow up care, meds to resume, as well as when to call the doctor. No scripts at dc. Discharged via wc to front entrance to meet awaiting vehicle to carry home. Accompanied by NT and daughter.

## 2017-07-25 NOTE — Progress Notes (Signed)
RUA PICC line d/c'ed er order for d/c home.  Site without signs of infection resent, cleaned with CHG, covered with vaseline guaze and dry 2x2.  Pt verbalizes understanding of signs and interventions for bleeding or infection via teachback method.  No AND.

## 2017-07-25 NOTE — Discharge Summary (Signed)
Patient ID: Kristin Gentry [id number] 1950/06/12 67 y.o.  Admit date: 07/20/2017 Discharge date: 07/25/2017  Admitting Diagnosis: Retroperitoneal hemorrhage S/p lap appy  Discharge Diagnosis Patient Active Problem List   Diagnosis Date Noted  . Retroperitoneal hemorrhage 07/21/2017  . Appendicitis, acute 07/16/2017  . Acute appendicitis 07/16/2017    Consultants none  Reason for Admission: Kristin Gentry had a ruptured appendix treated with lap appendectomy by Dr. Harlow Asa on 07/16/2017.  She did well and went home yesterday in good shape.  Yesterday afternoon, she experienced sudden onset of epigastric abdominal pain.  She was nauseated, vomited once, and was brought back to the [facility] by ambulance.  She has some mild GERD.  But no other chronic GI complaints. She has no history of ulcer disease.  Her mother required surgery for ulcer disease.  She has no liver or pancreas disease. She had a negative colonoscopy by Dr. Mellody Memos in 2015.  Her only other abdominal surgery was a vag hysterectomy in 2001.  She has been started on Actonel, which is associated with ulcers.  CT abdomen/pelvis - 07/20/2017 - Large retroperitoneal hematoma is again demonstrated. Small amount of free fluid throughout the abdomen and pelvis consistent with hemorrhagic fluid also again demonstrated. Small gas and fluid collection in the retroperitoneum adjacent to the duodenum is also unchanged. Small bilateral pleural effusions with basilar atelectasis. Postoperative changes consistent with recent appendectomy. No abscess identified.  Procedures none  Hospital Course:  The patient was admitted and initially NPO secondary to CT scan findings described above.  Her pain improved some within the first 1-2 days.  Her hgb remained stable.  She underwent an UGI on HD 2 that was negative for any type of leak or other significant finding.  She was then started on a diet and advanced as tolerated.  She was also  started on Fe supplements to help with her anemia.  She completed her 7 day course of abx therapy from her perforated appendicitis.  On HD 4, the patient had minimal discomfort, tolerating a regular diet, passing flatus, with a stable hemoglobin and otherwise stable for DC home.  Physical Exam: Heart: regular Lungs: CTAB Abd: soft, minimal upper abdominal tenderness, incisions are all healing well with no evidence of infection, +BS, less distention  Allergies as of 07/25/2017   No Known Allergies     Medication List    STOP taking these medications   amoxicillin 500 MG capsule Commonly known as:  AMOXIL     TAKE these medications   acetaminophen 500 MG tablet Commonly known as:  TYLENOL Take 1,000 mg by mouth every 6 (six) hours as needed for headache.   calcium carbonate 600 MG Tabs tablet Commonly known as:  OS-CAL Take 600 mg by mouth 2 (two) times daily with a meal.   cholecalciferol 1000 units tablet Commonly known as:  VITAMIN D Take 1,000 Units by mouth every morning.   docusate sodium 100 MG capsule Commonly known as:  COLACE Take 1 capsule (100 mg total) by mouth daily as needed.   ferrous sulfate 325 (65 FE) MG tablet Take 1 tablet (325 mg total) by mouth daily with breakfast.   HYDROcodone-acetaminophen 5-325 MG tablet Commonly known as:  NORCO/VICODIN Take 1-2 tablets by mouth every 6 (six) hours as needed for moderate pain.   levothyroxine 88 MCG tablet Commonly known as:  SYNTHROID, LEVOTHROID Take 88 mcg by mouth daily before breakfast.   risedronate 150 MG tablet Commonly known as:  ACTONEL Take 150 mg by mouth every 30 (thirty) days.   SYSTANE 0.4-0.3 % Soln Generic drug:  Polyethyl Glycol-Propyl Glycol Place 1 drop into both eyes daily.        Follow-up Information    Sur[facility], [place] Follow up on 08/06/2017.   Specialty:  General Surgery Why:  9:15am, arrive by 8:45am for check in Contact information: [address] [place] [address] [phone]           Signed: Saverio Danker, [facility] 07/25/2017, 8:57 AM Pager: [phone]

## 2017-07-25 NOTE — Discharge Instructions (Signed)
LAPAROSCOPIC SURGERY: POST OP INSTRUCTIONS  1. DIET: Follow a light bland diet the first 24 hours after arrival home, such as soup, liquids, crackers, etc. Be sure to include lots of fluids daily. Avoid fast food or heavy meals as your are more likely to get nauseated. Eat a low fat the next few days after surgery.  2. Take your usually prescribed home medications unless otherwise directed. 3. PAIN CONTROL:  1. Pain is best controlled by a usual combination of three different methods TOGETHER:  i. Ice/Heat ii. Over the counter pain medication iii. Prescription pain medication 2. Most patients will experience some swelling and bruising around the incisions. Ice packs or heating pads (30-60 minutes up to 6 times a day) will help. Use ice for the first few days to help decrease swelling and bruising, then switch to heat to help relax tight/sore spots and speed recovery. Some people prefer to use ice alone, heat alone, alternating between ice & heat. Experiment to what works for you. Swelling and bruising can take several weeks to resolve.  3. It is helpful to take an over-the-counter pain medication regularly for the first few weeks. Choose one of the following that works best for you:  i. Naproxen (Aleve, etc) Two 220mg  tabs twice a day ii. Ibuprofen (Advil, etc) Three 200mg  tabs four times a day (every meal & bedtime) iii. Acetaminophen (Tylenol, etc) 500-650mg  four times a day (every meal & bedtime) 4. A prescription for pain medication (such as oxycodone, hydrocodone, etc) should be given to you upon discharge. Take your pain medication as prescribed.  i. If you are having problems/concerns with the prescription medicine (does not control pain, nausea, vomiting, rash, itching, etc), please call us [phone] to see if we need to switch you to a different pain medicine that will work better for you and/or control your side effect better. ii. If you need a refill on your pain medication, please  contact your pharmacy. They will contact our office to request authorization. Prescriptions will not be filled after 5 pm or on week-ends. 1. Avoid getting constipated. Between the surgery and the pain medications, it is common to experience some constipation. Increasing fluid intake and taking a fiber supplement (such as Metamucil, Citrucel, FiberCon, MiraLax, etc) 1-2 times a day regularly will usually help prevent this problem from occurring. A mild laxative (prune juice, Milk of Magnesia, MiraLax, etc) should be taken according to package directions if there are no bowel movements after 48 hours.  2. Watch out for diarrhea. If you have many loose bowel movements, simplify your diet to bland foods & liquids for a few days. Stop any stool softeners and decrease your fiber supplement. Switching to mild anti-diarrheal medications (Kayopectate, Pepto Bismol) can help. If this worsens or does not improve, please call us. 3. Wash / shower every day. You may shower over the dressings as they are waterproof. Continue to shower over incision(s) after the dressing is off. 4. Remove your waterproof bandages 5 days after surgery. You may leave the incision open to air. You may replace a dressing/Band-Aid to cover the incision for comfort if you wish.  5. ACTIVITIES as tolerated:  a. You may resume regular (light) daily activities beginning the next day--such as daily self-care, walking, climbing stairs--gradually increasing activities as tolerated. If you can walk 30 minutes without difficulty, it is safe to try more intense activity such as jogging, treadmill, bicycling, low-impact aerobics, swimming, etc. b. Save the most intensive and strenuous activity  for last such as sit-ups, heavy lifting, contact sports, etc Refrain from any heavy lifting or straining until you are off narcotics for pain control.  c. DO NOT PUSH THROUGH PAIN. Let pain be your guide: If it hurts to do something, don't do it. Pain is your body  warning you to avoid that activity for another week until the pain goes down. d. You may drive when you are no longer taking prescription pain medication, you can comfortably wear a seatbelt, and you can safely maneuver your car and apply brakes. e. Dennis Bast may have sexual intercourse when it is comfortable.  6. FOLLOW UP in our office  a. Please call [facility] at [phone] to set up an appointment to see your surgeon in the office for a follow-up appointment approximately 2-3 weeks after your surgery. b. Make sure that you call for this appointment the day you arrive home to insure a convenient appointment time.      10. IF YOU HAVE DISABILITY OR FAMILY LEAVE FORMS, BRING THEM TO THE               OFFICE FOR PROCESSING.   WHEN TO CALL us [phone]:  1. Poor pain control 2. Reactions / problems with new medications (rash/itching, nausea, etc)  3. Fever over 101.5 F (38.5 C) 4. Inability to urinate 5. Nausea and/or vomiting 6. Worsening swelling or bruising 7. Continued bleeding from incision. 8. Increased pain, redness, or drainage from the incision  The clinic staff is available to answer your questions during regular business hours (8:30am-5pm). Please dont hesitate to call and ask to speak to one of our nurses for clinical concerns.  If you have a medical emergency, go to the nearest emergency room or call 911.  A surgeon from [facility] is always on call at the [facility], [place], [place], [place], [place] [address] ?  MAIN: [phone] ? TOLL FREE: [phone] ?  FAX [phone]) [phone]  [email or web address]

## 2017-08-15 DIAGNOSIS — Z9049 Acquired absence of other specified parts of digestive tract: Secondary | ICD-10-CM | POA: Diagnosis not present

## 2017-08-15 DIAGNOSIS — R58 Hemorrhage, not elsewhere classified: Secondary | ICD-10-CM | POA: Diagnosis not present

## 2017-08-16 DIAGNOSIS — R58 Hemorrhage, not elsewhere classified: Secondary | ICD-10-CM | POA: Diagnosis not present

## 2017-08-16 DIAGNOSIS — R748 Abnormal levels of other serum enzymes: Secondary | ICD-10-CM | POA: Diagnosis not present

## 2017-10-23 DIAGNOSIS — M810 Age-related osteoporosis without current pathological fracture: Secondary | ICD-10-CM | POA: Diagnosis not present

## 2017-10-23 DIAGNOSIS — Z0000 Encounter for general adult medical examination without abnormal findings: Secondary | ICD-10-CM | POA: Diagnosis not present

## 2017-10-23 DIAGNOSIS — E039 Hypothyroidism, unspecified: Secondary | ICD-10-CM | POA: Diagnosis not present

## 2017-10-23 DIAGNOSIS — E7800 Pure hypercholesterolemia, unspecified: Secondary | ICD-10-CM | POA: Diagnosis not present

## 2017-10-23 DIAGNOSIS — Z1389 Encounter for screening for other disorder: Secondary | ICD-10-CM | POA: Diagnosis not present

## 2017-10-23 DIAGNOSIS — Z23 Encounter for immunization: Secondary | ICD-10-CM | POA: Diagnosis not present

## 2017-11-04 ENCOUNTER — Encounter (HOSPITAL_COMMUNITY): Payer: Medicare Other

## 2017-11-26 DIAGNOSIS — R69 Illness, unspecified: Secondary | ICD-10-CM | POA: Diagnosis not present

## 2017-12-01 DIAGNOSIS — R69 Illness, unspecified: Secondary | ICD-10-CM | POA: Diagnosis not present

## 2017-12-11 DIAGNOSIS — E039 Hypothyroidism, unspecified: Secondary | ICD-10-CM | POA: Diagnosis not present

## 2018-01-15 DIAGNOSIS — M810 Age-related osteoporosis without current pathological fracture: Secondary | ICD-10-CM | POA: Diagnosis not present

## 2018-02-03 ENCOUNTER — Other Ambulatory Visit: Payer: Self-pay | Admitting: Internal Medicine

## 2018-02-03 DIAGNOSIS — Z1231 Encounter for screening mammogram for malignant neoplasm of breast: Secondary | ICD-10-CM

## 2018-03-12 ENCOUNTER — Ambulatory Visit
Admission: RE | Admit: 2018-03-12 | Discharge: 2018-03-12 | Disposition: A | Discharge status: Home / Self Care | Payer: Medicare HMO | Source: Ambulatory Visit | Attending: Internal Medicine | Admitting: Internal Medicine

## 2018-03-12 DIAGNOSIS — Z1231 Encounter for screening mammogram for malignant neoplasm of breast: Secondary | ICD-10-CM | POA: Diagnosis not present

## 2018-07-08 DIAGNOSIS — R69 Illness, unspecified: Secondary | ICD-10-CM | POA: Diagnosis not present

## 2018-08-05 DIAGNOSIS — M810 Age-related osteoporosis without current pathological fracture: Secondary | ICD-10-CM | POA: Diagnosis not present

## 2018-08-12 DIAGNOSIS — R69 Illness, unspecified: Secondary | ICD-10-CM | POA: Diagnosis not present

## 2018-10-27 DIAGNOSIS — Z1389 Encounter for screening for other disorder: Secondary | ICD-10-CM | POA: Diagnosis not present

## 2018-10-27 DIAGNOSIS — Z1159 Encounter for screening for other viral diseases: Secondary | ICD-10-CM | POA: Diagnosis not present

## 2018-10-27 DIAGNOSIS — Z23 Encounter for immunization: Secondary | ICD-10-CM | POA: Diagnosis not present

## 2018-10-27 DIAGNOSIS — Z86010 Personal history of colonic polyps: Secondary | ICD-10-CM | POA: Diagnosis not present

## 2018-10-27 DIAGNOSIS — Z0000 Encounter for general adult medical examination without abnormal findings: Secondary | ICD-10-CM | POA: Diagnosis not present

## 2018-10-27 DIAGNOSIS — E039 Hypothyroidism, unspecified: Secondary | ICD-10-CM | POA: Diagnosis not present

## 2018-10-27 DIAGNOSIS — M810 Age-related osteoporosis without current pathological fracture: Secondary | ICD-10-CM | POA: Diagnosis not present

## 2018-11-28 DIAGNOSIS — Z1159 Encounter for screening for other viral diseases: Secondary | ICD-10-CM | POA: Diagnosis not present

## 2018-12-03 DIAGNOSIS — K648 Other hemorrhoids: Secondary | ICD-10-CM | POA: Diagnosis not present

## 2018-12-03 DIAGNOSIS — D123 Benign neoplasm of transverse colon: Secondary | ICD-10-CM | POA: Diagnosis not present

## 2018-12-03 DIAGNOSIS — Z86010 Personal history of colonic polyps: Secondary | ICD-10-CM | POA: Diagnosis not present

## 2018-12-03 LAB — HM COLONOSCOPY

## 2018-12-05 DIAGNOSIS — D123 Benign neoplasm of transverse colon: Secondary | ICD-10-CM | POA: Diagnosis not present

## 2019-01-13 DIAGNOSIS — R69 Illness, unspecified: Secondary | ICD-10-CM | POA: Diagnosis not present

## 2019-02-02 ENCOUNTER — Other Ambulatory Visit: Payer: Self-pay

## 2019-02-02 DIAGNOSIS — Z20822 Contact with and (suspected) exposure to covid-19: Secondary | ICD-10-CM

## 2019-02-03 LAB — NOVEL CORONAVIRUS, NAA: SARS-CoV-2, NAA: DETECTED — AB

## 2019-03-09 DIAGNOSIS — M810 Age-related osteoporosis without current pathological fracture: Secondary | ICD-10-CM | POA: Diagnosis not present

## 2019-03-24 ENCOUNTER — Other Ambulatory Visit: Payer: Self-pay | Admitting: Internal Medicine

## 2019-03-24 DIAGNOSIS — Z1231 Encounter for screening mammogram for malignant neoplasm of breast: Secondary | ICD-10-CM

## 2019-04-15 DIAGNOSIS — M810 Age-related osteoporosis without current pathological fracture: Secondary | ICD-10-CM | POA: Diagnosis not present

## 2019-04-30 ENCOUNTER — Ambulatory Visit: Payer: Medicare HMO

## 2019-05-28 DIAGNOSIS — K921 Melena: Secondary | ICD-10-CM | POA: Diagnosis not present

## 2019-07-10 ENCOUNTER — Ambulatory Visit
Admission: RE | Admit: 2019-07-10 | Discharge: 2019-07-10 | Disposition: A | Discharge status: Home / Self Care | Payer: Medicare HMO | Source: Ambulatory Visit | Attending: Internal Medicine | Admitting: Internal Medicine

## 2019-07-10 ENCOUNTER — Other Ambulatory Visit: Payer: Self-pay

## 2019-07-10 DIAGNOSIS — Z1231 Encounter for screening mammogram for malignant neoplasm of breast: Secondary | ICD-10-CM | POA: Diagnosis not present

## 2019-09-01 DIAGNOSIS — R69 Illness, unspecified: Secondary | ICD-10-CM | POA: Diagnosis not present

## 2019-09-03 DIAGNOSIS — D1801 Hemangioma of skin and subcutaneous tissue: Secondary | ICD-10-CM | POA: Diagnosis not present

## 2019-09-03 DIAGNOSIS — R69 Illness, unspecified: Secondary | ICD-10-CM | POA: Diagnosis not present

## 2019-09-03 DIAGNOSIS — L821 Other seborrheic keratosis: Secondary | ICD-10-CM | POA: Diagnosis not present

## 2019-09-03 DIAGNOSIS — D485 Neoplasm of uncertain behavior of skin: Secondary | ICD-10-CM | POA: Diagnosis not present

## 2019-09-03 DIAGNOSIS — L814 Other melanin hyperpigmentation: Secondary | ICD-10-CM | POA: Diagnosis not present

## 2019-10-29 DIAGNOSIS — Z86010 Personal history of colonic polyps: Secondary | ICD-10-CM | POA: Diagnosis not present

## 2019-10-29 DIAGNOSIS — R69 Illness, unspecified: Secondary | ICD-10-CM | POA: Diagnosis not present

## 2019-10-29 DIAGNOSIS — E039 Hypothyroidism, unspecified: Secondary | ICD-10-CM | POA: Diagnosis not present

## 2019-10-29 DIAGNOSIS — Z1389 Encounter for screening for other disorder: Secondary | ICD-10-CM | POA: Diagnosis not present

## 2019-10-29 DIAGNOSIS — Z0000 Encounter for general adult medical examination without abnormal findings: Secondary | ICD-10-CM | POA: Diagnosis not present

## 2019-10-29 DIAGNOSIS — M810 Age-related osteoporosis without current pathological fracture: Secondary | ICD-10-CM | POA: Diagnosis not present

## 2019-12-04 DIAGNOSIS — E039 Hypothyroidism, unspecified: Secondary | ICD-10-CM | POA: Diagnosis not present

## 2020-01-08 DIAGNOSIS — R69 Illness, unspecified: Secondary | ICD-10-CM | POA: Diagnosis not present

## 2020-03-03 DIAGNOSIS — G43809 Other migraine, not intractable, without status migrainosus: Secondary | ICD-10-CM | POA: Diagnosis not present

## 2020-03-03 DIAGNOSIS — H2513 Age-related nuclear cataract, bilateral: Secondary | ICD-10-CM | POA: Diagnosis not present

## 2020-03-03 DIAGNOSIS — H31012 Macula scars of posterior pole (postinflammatory) (post-traumatic), left eye: Secondary | ICD-10-CM | POA: Diagnosis not present

## 2020-05-18 DIAGNOSIS — M810 Age-related osteoporosis without current pathological fracture: Secondary | ICD-10-CM | POA: Diagnosis not present

## 2020-06-26 DIAGNOSIS — Z0100 Encounter for examination of eyes and vision without abnormal findings: Secondary | ICD-10-CM | POA: Diagnosis not present

## 2020-06-29 ENCOUNTER — Other Ambulatory Visit: Payer: Self-pay | Admitting: Internal Medicine

## 2020-06-29 DIAGNOSIS — Z1231 Encounter for screening mammogram for malignant neoplasm of breast: Secondary | ICD-10-CM

## 2020-07-06 DIAGNOSIS — R69 Illness, unspecified: Secondary | ICD-10-CM | POA: Diagnosis not present

## 2020-07-20 DIAGNOSIS — Z86010 Personal history of colonic polyps: Secondary | ICD-10-CM | POA: Diagnosis not present

## 2020-07-20 DIAGNOSIS — D120 Benign neoplasm of cecum: Secondary | ICD-10-CM | POA: Diagnosis not present

## 2020-07-20 DIAGNOSIS — D123 Benign neoplasm of transverse colon: Secondary | ICD-10-CM | POA: Diagnosis not present

## 2020-07-20 DIAGNOSIS — K6389 Other specified diseases of intestine: Secondary | ICD-10-CM | POA: Diagnosis not present

## 2020-07-20 DIAGNOSIS — K648 Other hemorrhoids: Secondary | ICD-10-CM | POA: Diagnosis not present

## 2020-07-20 DIAGNOSIS — D124 Benign neoplasm of descending colon: Secondary | ICD-10-CM | POA: Diagnosis not present

## 2020-07-20 LAB — HM COLONOSCOPY

## 2020-07-22 DIAGNOSIS — D123 Benign neoplasm of transverse colon: Secondary | ICD-10-CM | POA: Diagnosis not present

## 2020-07-22 DIAGNOSIS — D120 Benign neoplasm of cecum: Secondary | ICD-10-CM | POA: Diagnosis not present

## 2020-07-22 DIAGNOSIS — D124 Benign neoplasm of descending colon: Secondary | ICD-10-CM | POA: Diagnosis not present

## 2020-08-02 DIAGNOSIS — R69 Illness, unspecified: Secondary | ICD-10-CM | POA: Diagnosis not present

## 2020-08-22 ENCOUNTER — Ambulatory Visit
Admission: RE | Admit: 2020-08-22 | Discharge: 2020-08-22 | Disposition: A | Discharge status: Home / Self Care | Payer: Medicare HMO | Source: Ambulatory Visit | Attending: Internal Medicine | Admitting: Internal Medicine

## 2020-08-22 ENCOUNTER — Other Ambulatory Visit: Payer: Self-pay

## 2020-08-22 DIAGNOSIS — Z1231 Encounter for screening mammogram for malignant neoplasm of breast: Secondary | ICD-10-CM | POA: Diagnosis not present

## 2020-09-06 DIAGNOSIS — D2262 Melanocytic nevi of left upper limb, including shoulder: Secondary | ICD-10-CM | POA: Diagnosis not present

## 2020-09-06 DIAGNOSIS — D692 Other nonthrombocytopenic purpura: Secondary | ICD-10-CM | POA: Diagnosis not present

## 2020-09-06 DIAGNOSIS — L820 Inflamed seborrheic keratosis: Secondary | ICD-10-CM | POA: Diagnosis not present

## 2020-09-06 DIAGNOSIS — D1801 Hemangioma of skin and subcutaneous tissue: Secondary | ICD-10-CM | POA: Diagnosis not present

## 2020-09-06 DIAGNOSIS — L570 Actinic keratosis: Secondary | ICD-10-CM | POA: Diagnosis not present

## 2020-11-18 DIAGNOSIS — E039 Hypothyroidism, unspecified: Secondary | ICD-10-CM | POA: Diagnosis not present

## 2020-11-18 DIAGNOSIS — M810 Age-related osteoporosis without current pathological fracture: Secondary | ICD-10-CM | POA: Diagnosis not present

## 2020-11-18 DIAGNOSIS — Z86010 Personal history of colonic polyps: Secondary | ICD-10-CM | POA: Diagnosis not present

## 2020-11-18 DIAGNOSIS — Z0000 Encounter for general adult medical examination without abnormal findings: Secondary | ICD-10-CM | POA: Diagnosis not present

## 2020-11-18 DIAGNOSIS — R69 Illness, unspecified: Secondary | ICD-10-CM | POA: Diagnosis not present

## 2020-11-18 DIAGNOSIS — Z23 Encounter for immunization: Secondary | ICD-10-CM | POA: Diagnosis not present

## 2020-11-21 ENCOUNTER — Other Ambulatory Visit: Payer: Self-pay | Admitting: Internal Medicine

## 2020-11-21 DIAGNOSIS — M810 Age-related osteoporosis without current pathological fracture: Secondary | ICD-10-CM

## 2021-03-07 DIAGNOSIS — H5712 Ocular pain, left eye: Secondary | ICD-10-CM | POA: Diagnosis not present

## 2021-03-07 DIAGNOSIS — H11422 Conjunctival edema, left eye: Secondary | ICD-10-CM | POA: Diagnosis not present

## 2021-03-07 DIAGNOSIS — H2513 Age-related nuclear cataract, bilateral: Secondary | ICD-10-CM | POA: Diagnosis not present

## 2021-03-07 DIAGNOSIS — H40052 Ocular hypertension, left eye: Secondary | ICD-10-CM | POA: Diagnosis not present

## 2021-03-21 DIAGNOSIS — H40052 Ocular hypertension, left eye: Secondary | ICD-10-CM | POA: Diagnosis not present

## 2021-04-07 DIAGNOSIS — H40052 Ocular hypertension, left eye: Secondary | ICD-10-CM | POA: Diagnosis not present

## 2021-04-07 DIAGNOSIS — H11422 Conjunctival edema, left eye: Secondary | ICD-10-CM | POA: Diagnosis not present

## 2021-04-07 DIAGNOSIS — H5712 Ocular pain, left eye: Secondary | ICD-10-CM | POA: Diagnosis not present

## 2021-04-27 DIAGNOSIS — H2513 Age-related nuclear cataract, bilateral: Secondary | ICD-10-CM | POA: Diagnosis not present

## 2021-04-27 DIAGNOSIS — H40052 Ocular hypertension, left eye: Secondary | ICD-10-CM | POA: Diagnosis not present

## 2021-04-27 DIAGNOSIS — H31012 Macula scars of posterior pole (postinflammatory) (post-traumatic), left eye: Secondary | ICD-10-CM | POA: Diagnosis not present

## 2021-04-27 DIAGNOSIS — Z0100 Encounter for examination of eyes and vision without abnormal findings: Secondary | ICD-10-CM | POA: Diagnosis not present

## 2021-05-09 ENCOUNTER — Other Ambulatory Visit: Payer: Medicare HMO

## 2021-05-19 DIAGNOSIS — M810 Age-related osteoporosis without current pathological fracture: Secondary | ICD-10-CM | POA: Diagnosis not present

## 2021-06-20 ENCOUNTER — Ambulatory Visit
Admission: RE | Admit: 2021-06-20 | Discharge: 2021-06-20 | Disposition: A | Discharge status: Home / Self Care | Payer: Medicare HMO | Source: Ambulatory Visit | Attending: Internal Medicine | Admitting: Internal Medicine

## 2021-06-20 DIAGNOSIS — Z780 Asymptomatic menopausal state: Secondary | ICD-10-CM | POA: Diagnosis not present

## 2021-06-20 DIAGNOSIS — M810 Age-related osteoporosis without current pathological fracture: Secondary | ICD-10-CM | POA: Diagnosis not present

## 2021-06-20 LAB — HM DEXA SCAN

## 2021-08-02 DIAGNOSIS — H31092 Other chorioretinal scars, left eye: Secondary | ICD-10-CM | POA: Diagnosis not present

## 2021-08-02 DIAGNOSIS — H40052 Ocular hypertension, left eye: Secondary | ICD-10-CM | POA: Diagnosis not present

## 2021-08-02 DIAGNOSIS — H31012 Macula scars of posterior pole (postinflammatory) (post-traumatic), left eye: Secondary | ICD-10-CM | POA: Diagnosis not present

## 2021-08-02 DIAGNOSIS — H2513 Age-related nuclear cataract, bilateral: Secondary | ICD-10-CM | POA: Diagnosis not present

## 2021-08-31 ENCOUNTER — Other Ambulatory Visit: Payer: Self-pay | Admitting: Internal Medicine

## 2021-08-31 DIAGNOSIS — Z1231 Encounter for screening mammogram for malignant neoplasm of breast: Secondary | ICD-10-CM

## 2021-09-06 DIAGNOSIS — H40052 Ocular hypertension, left eye: Secondary | ICD-10-CM | POA: Diagnosis not present

## 2021-09-07 DIAGNOSIS — R5383 Other fatigue: Secondary | ICD-10-CM | POA: Diagnosis not present

## 2021-09-07 LAB — LAB REPORT - SCANNED: EGFR: 89

## 2021-09-14 ENCOUNTER — Ambulatory Visit
Admission: RE | Admit: 2021-09-14 | Discharge: 2021-09-14 | Disposition: A | Discharge status: Home / Self Care | Payer: Medicare HMO | Source: Ambulatory Visit | Attending: Internal Medicine | Admitting: Internal Medicine

## 2021-09-14 DIAGNOSIS — Z1231 Encounter for screening mammogram for malignant neoplasm of breast: Secondary | ICD-10-CM

## 2021-09-14 LAB — HM MAMMOGRAPHY

## 2021-09-25 LAB — LAB REPORT - SCANNED: EGFR: 86

## 2021-10-02 DIAGNOSIS — H21552 Recession of chamber angle, left eye: Secondary | ICD-10-CM | POA: Diagnosis not present

## 2021-10-02 DIAGNOSIS — H31012 Macula scars of posterior pole (postinflammatory) (post-traumatic), left eye: Secondary | ICD-10-CM | POA: Diagnosis not present

## 2021-10-02 DIAGNOSIS — H31092 Other chorioretinal scars, left eye: Secondary | ICD-10-CM | POA: Diagnosis not present

## 2021-10-02 DIAGNOSIS — H40052 Ocular hypertension, left eye: Secondary | ICD-10-CM | POA: Diagnosis not present

## 2021-10-19 DIAGNOSIS — H40052 Ocular hypertension, left eye: Secondary | ICD-10-CM | POA: Diagnosis not present

## 2021-10-28 IMAGING — MG DIGITAL SCREENING BREAST BILAT IMPLANT W/ TOMO W/ CAD
9 of 12 series · 9 of 28 positions shown · non-contrast
Comparison: Previous exam(s).

CLINICAL DATA: Screening.

EXAM:
DIGITAL SCREENING BILATERAL MAMMOGRAM WITH IMPLANTS, CAD AND
TOMOSYNTHESIS
TECHNIQUE: Bilateral screening digital craniocaudal and mediolateral oblique
mammograms were obtained. Bilateral screening digital breast
tomosynthesis was performed. The images were evaluated with
computer-aided detection. Standard and/or implant displaced views
were performed.

[L CC]
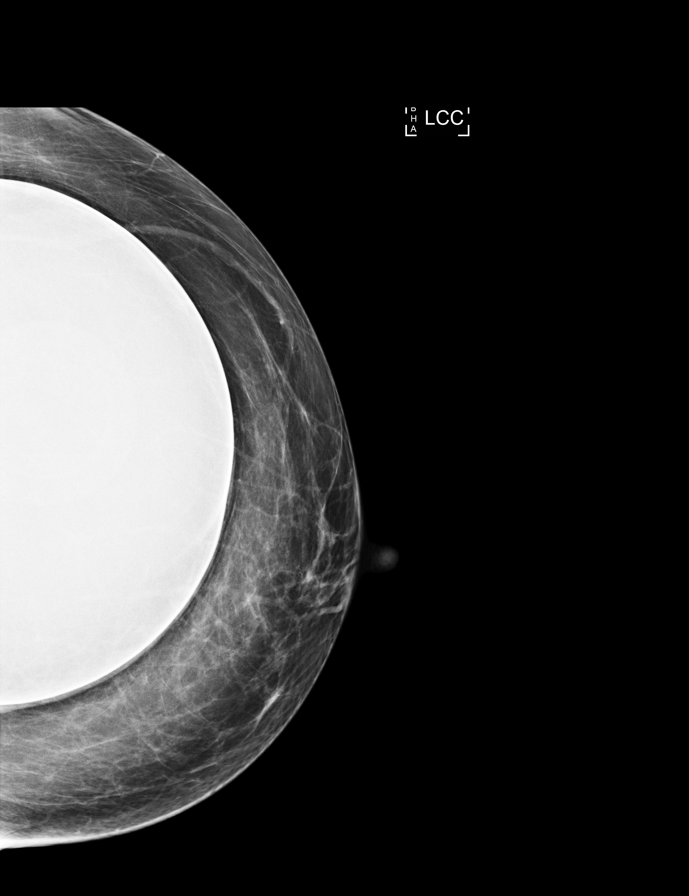

[L MLO]
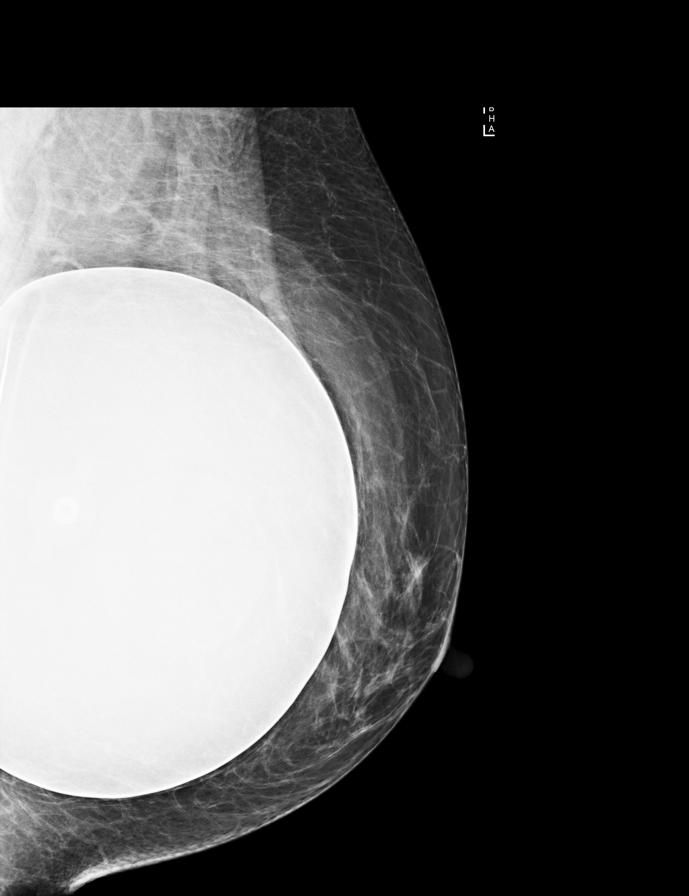

[R CC]
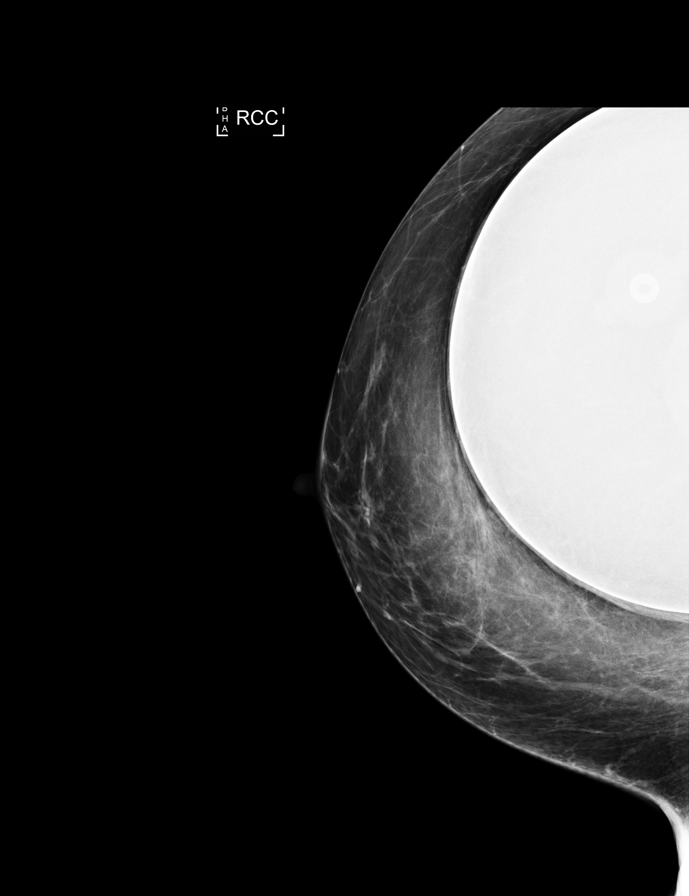

[R MLO]
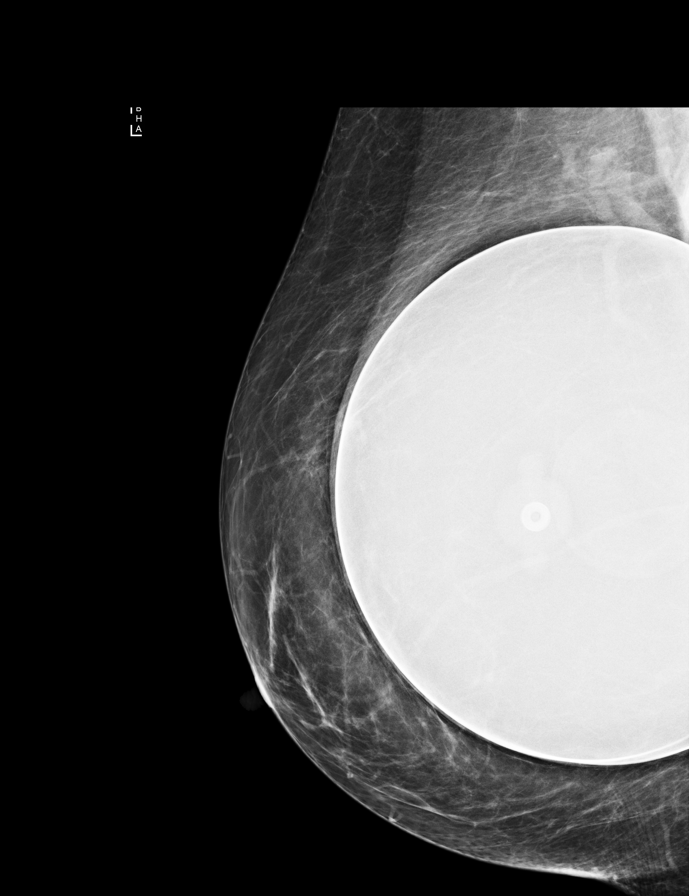

[R MLO synth-2D]
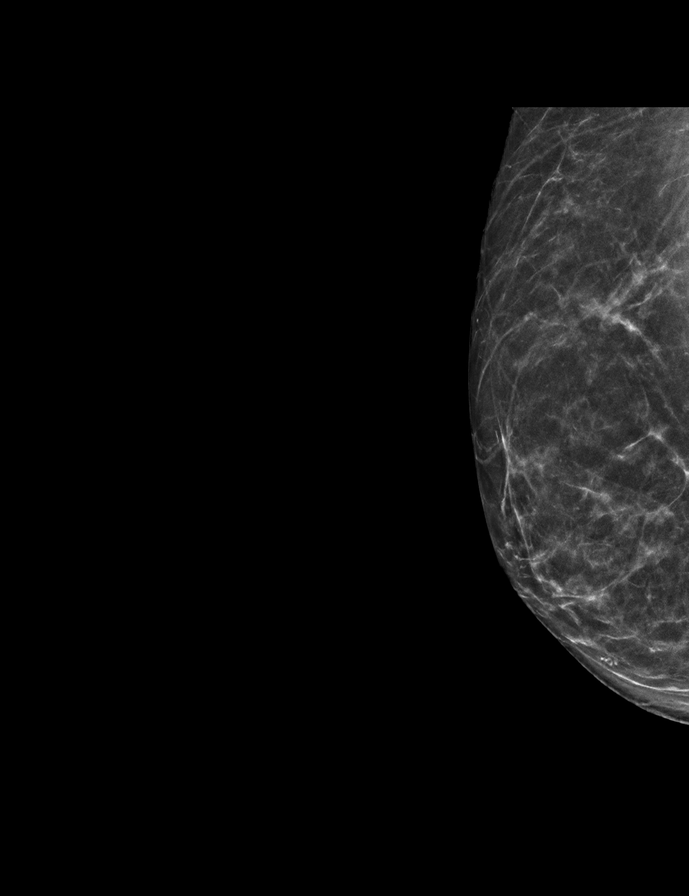

[L CC synth-2D]
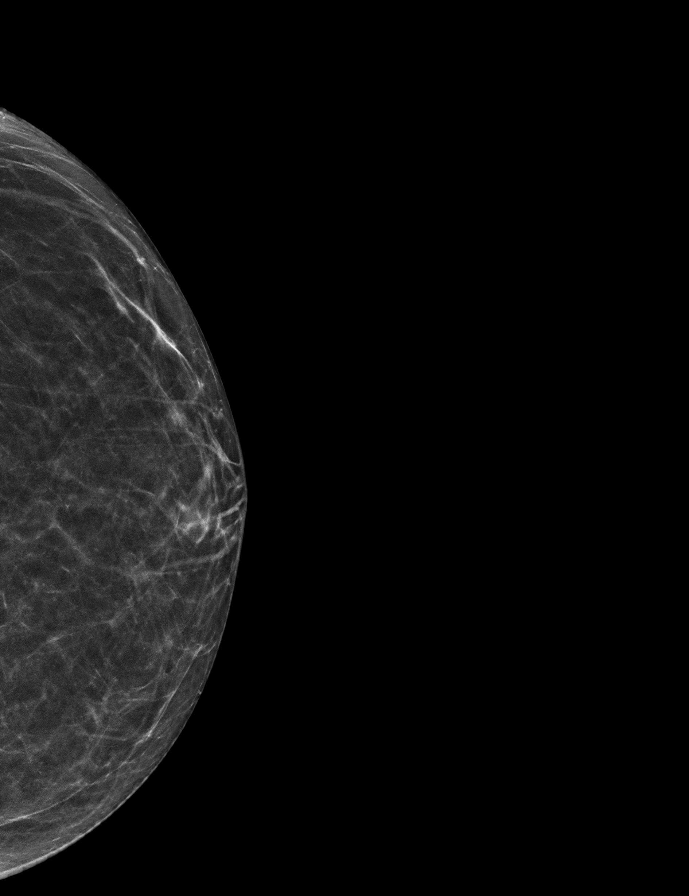

[R CC synth-2D]
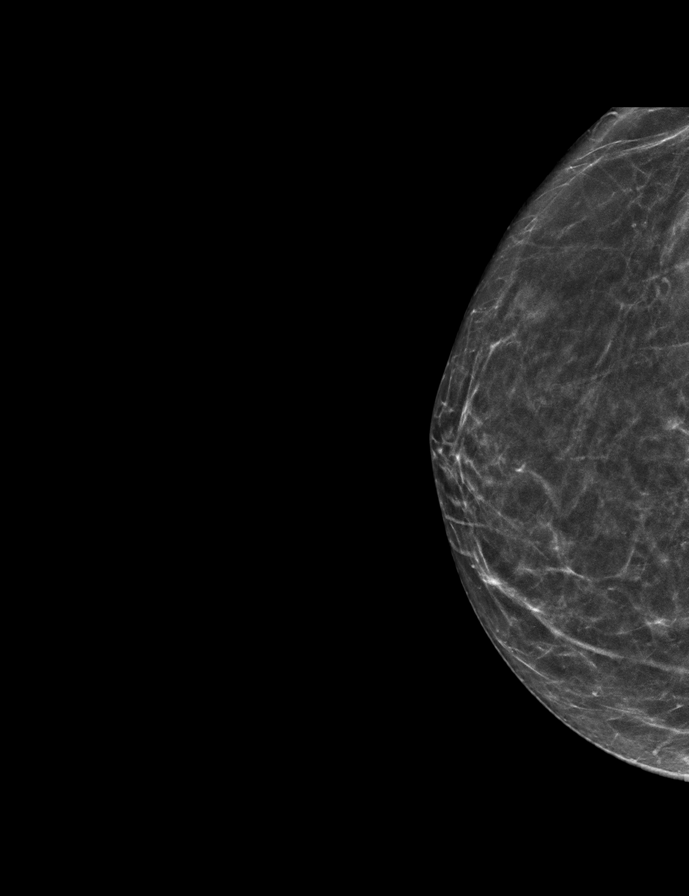

[L MLO synth-2D]
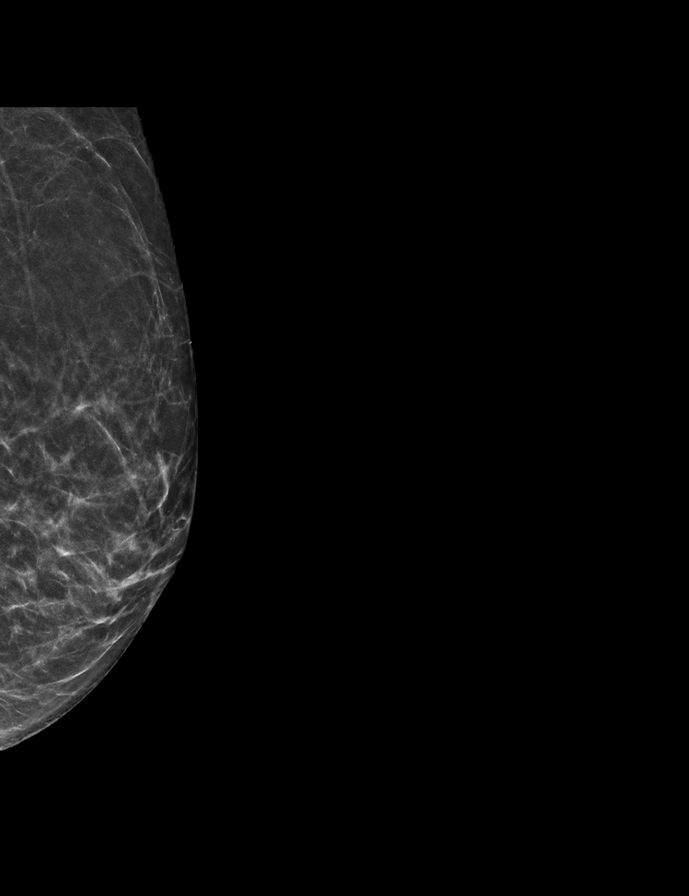

[R CCID BREAST TOMOSYNTHESIS IMAGE tomo · tomo slice 26/51.0]
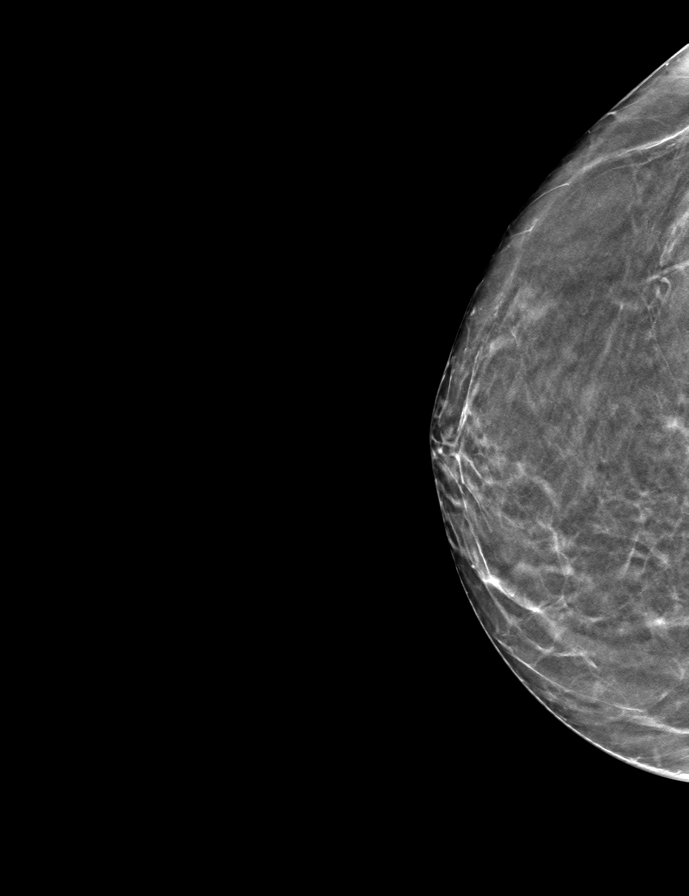

[9 of 28 positions shown; findings below may reference images not displayed]

ACR Breast Density Category b: There are scattered areas of
fibroglandular density.
FINDINGS: The patient has retropectoral implants. There are no findings
suspicious for malignancy.
IMPRESSION: No mammographic evidence of malignancy. A result letter of this
screening mammogram will be mailed directly to the patient.

RECOMMENDATION:
Screening mammogram in one year. (Code:SE-S-JMG)

BI-RADS CATEGORY  1:  Negative.

## 2021-11-08 DIAGNOSIS — E039 Hypothyroidism, unspecified: Secondary | ICD-10-CM | POA: Diagnosis not present

## 2021-11-08 DIAGNOSIS — G4733 Obstructive sleep apnea (adult) (pediatric): Secondary | ICD-10-CM | POA: Diagnosis not present

## 2021-11-20 DIAGNOSIS — M810 Age-related osteoporosis without current pathological fracture: Secondary | ICD-10-CM | POA: Diagnosis not present

## 2021-11-20 DIAGNOSIS — Z23 Encounter for immunization: Secondary | ICD-10-CM | POA: Diagnosis not present

## 2021-11-20 DIAGNOSIS — G4733 Obstructive sleep apnea (adult) (pediatric): Secondary | ICD-10-CM | POA: Diagnosis not present

## 2021-11-20 DIAGNOSIS — E039 Hypothyroidism, unspecified: Secondary | ICD-10-CM | POA: Diagnosis not present

## 2021-11-20 DIAGNOSIS — Z1331 Encounter for screening for depression: Secondary | ICD-10-CM | POA: Diagnosis not present

## 2021-11-24 DIAGNOSIS — G4733 Obstructive sleep apnea (adult) (pediatric): Secondary | ICD-10-CM | POA: Diagnosis not present

## 2021-11-24 DIAGNOSIS — R400 Somnolence: Secondary | ICD-10-CM | POA: Diagnosis not present

## 2021-12-24 DIAGNOSIS — G4733 Obstructive sleep apnea (adult) (pediatric): Secondary | ICD-10-CM | POA: Diagnosis not present

## 2021-12-24 DIAGNOSIS — R400 Somnolence: Secondary | ICD-10-CM | POA: Diagnosis not present

## 2022-01-24 DIAGNOSIS — G4733 Obstructive sleep apnea (adult) (pediatric): Secondary | ICD-10-CM | POA: Diagnosis not present

## 2022-01-24 DIAGNOSIS — R400 Somnolence: Secondary | ICD-10-CM | POA: Diagnosis not present

## 2022-02-01 ENCOUNTER — Ambulatory Visit (INDEPENDENT_AMBULATORY_CARE_PROVIDER_SITE_OTHER): Payer: Medicare HMO | Admitting: Nurse Practitioner

## 2022-02-01 ENCOUNTER — Encounter: Payer: Self-pay | Admitting: Nurse Practitioner

## 2022-02-01 VITALS — BP 124/80 | HR 68 | Temp 98.6°F | Wt 130.0 lb

## 2022-02-01 DIAGNOSIS — R059 Cough, unspecified: Secondary | ICD-10-CM | POA: Diagnosis not present

## 2022-02-01 DIAGNOSIS — Z20828 Contact with and (suspected) exposure to other viral communicable diseases: Secondary | ICD-10-CM | POA: Diagnosis not present

## 2022-02-01 LAB — POCT INFLUENZA A/B
Influenza A, POC: NEGATIVE
Influenza B, POC: NEGATIVE

## 2022-02-01 LAB — POC COVID19 BINAXNOW: SARS Coronavirus 2 Ag: NEGATIVE

## 2022-02-01 MED ORDER — BENZONATATE 200 MG PO CAPS: 200.0000 mg | ORAL_CAPSULE | Freq: Three times a day (TID) | ORAL | 3 refills | Status: DC | PRN

## 2022-02-01 MED ORDER — AZITHROMYCIN 250 MG PO TABS: ORAL_TABLET | ORAL | 0 refills | Status: AC

## 2022-02-01 MED ORDER — HYDROCODONE BIT-HOMATROP MBR 5-1.5 MG/5ML PO SOLN: 5.0000 mL | Freq: Three times a day (TID) | ORAL | 0 refills | Status: DC | PRN

## 2022-02-01 NOTE — Patient Instructions (Signed)
I have sent in azithromycin for you. I have also sent the cough syrup and tessalon pearls for use during the day.   If you are not feeling better into next week please let me know.

## 2022-02-01 NOTE — Progress Notes (Signed)
Orma Render, DNP, AGNP-c [place] [address] [place], [place] [address] [phone]  Subjective:   Kristin Gentry is a 72 y.o. female presents to day for evaluation of: Cough  [place] tells me that she started with a sore throat on Saturday/Sunday. The symptoms then progressed to a cough that was dry and hacky, like a bark. The cough then transitioned to a little more productive and she started losing her voice. She is having yellow mucous production with her cough and she is experiencing some shortness of breath with exertion. She found out Sunday evening her granddaughter, who she was with over the weekend last weekend, tested positive for COVID.  She also found out that her grandson (who lives with her) is flu positive.   PMH, Medications, and Allergies reviewed and updated in chart as appropriate.   ROS negative except for what is listed in HPI. Objective:  BP 124/80   Pulse 68   Temp 98.6 F (37 C)   Wt 130 lb (59 kg)   BMI 21.63 kg/m  Physical Exam Vitals and nursing note reviewed.  Constitutional:      Appearance: Normal appearance.  HENT:     Head: Normocephalic.     Nose: Congestion present.     Mouth/Throat:     Mouth: Mucous membranes are moist.     Pharynx: Posterior oropharyngeal erythema present.  Eyes:     Extraocular Movements: Extraocular movements intact.     Pupils: Pupils are equal, round, and reactive to light.  Neck:     Vascular: No carotid bruit.  Cardiovascular:     Rate and Rhythm: Normal rate and regular rhythm.     Pulses: Normal pulses.     Heart sounds: Normal heart sounds.  Pulmonary:     Effort: Pulmonary effort is normal.     Breath sounds: Wheezing and rhonchi present.  Abdominal:     General: Bowel sounds are normal.     Palpations: Abdomen is soft.  Musculoskeletal:     Right lower leg: No edema.     Left lower leg: No edema.  Lymphadenopathy:     Cervical: Cervical adenopathy present.  Skin:     General: Skin is warm and dry.     Capillary Refill: Capillary refill takes less than 2 seconds.  Neurological:     General: No focal deficit present.     Mental Status: She is alert and oriented to person, place, and time.     Motor: No weakness.  Psychiatric:        Mood and Affect: Mood normal.           Assessment & Plan:   Problem List Items Addressed This Visit     RESOLVED: Cough - Primary   Relevant Medications   benzonatate (TESSALON) 200 MG capsule   HYDROcodone bit-homatropine (HYCODAN) 5-1.5 MG/5ML syrup   Other Relevant Orders   POC COVID-19 (Completed)   Influenza A/B (Completed)   Exposure to the flu    COVID and flu tests are negative in the office today.  Given her current symptoms we will go ahead and start her on azithromycin and add Hycodan and Tessalon for the cough.  Do recommend that she use her albuterol inhaler.  We will also consider addition of prednisone if her respiratory symptoms worsen or fail to improve.         Orma Render, DNP, AGNP-c 02/20/2022  4:56 PM    History, Medications, Surgery, SDOH, and  Family History reviewed and updated as appropriate.

## 2022-02-05 ENCOUNTER — Telehealth: Payer: Self-pay | Admitting: Nurse Practitioner

## 2022-02-05 NOTE — Telephone Encounter (Signed)
Received requested records from [facility] @ [place].

## 2022-02-12 ENCOUNTER — Encounter: Payer: Self-pay | Admitting: Nurse Practitioner

## 2022-02-12 ENCOUNTER — Ambulatory Visit (INDEPENDENT_AMBULATORY_CARE_PROVIDER_SITE_OTHER): Payer: Medicare HMO | Admitting: Nurse Practitioner

## 2022-02-12 VITALS — BP 128/80 | HR 84 | Temp 98.2°F | Wt 130.8 lb

## 2022-02-12 DIAGNOSIS — R059 Cough, unspecified: Secondary | ICD-10-CM | POA: Diagnosis not present

## 2022-02-12 DIAGNOSIS — Z20828 Contact with and (suspected) exposure to other viral communicable diseases: Secondary | ICD-10-CM

## 2022-02-12 MED ORDER — PREDNISONE 20 MG PO TABS: ORAL_TABLET | ORAL | 0 refills | Status: DC

## 2022-02-12 MED ORDER — ALBUTEROL SULFATE HFA 108 (90 BASE) MCG/ACT IN AERS: 1.0000 | INHALATION_SPRAY | Freq: Four times a day (QID) | RESPIRATORY_TRACT | 1 refills | Status: DC | PRN

## 2022-02-12 MED ORDER — METHYLPREDNISOLONE ACETATE 40 MG/ML IJ SUSP
40.0000 mg | Freq: Once | INTRAMUSCULAR | Status: AC
  Administered 2022-02-12: 40 mg via INTRAMUSCULAR

## 2022-02-12 MED ORDER — AZITHROMYCIN 250 MG PO TABS: ORAL_TABLET | ORAL | 0 refills | Status: AC

## 2022-02-12 NOTE — Progress Notes (Signed)
  Orma Render, DNP, AGNP-c [place] [address] [place], [place] [address] [phone]  Subjective:   Kristin Gentry is a 71 y.o. female presents to day for evaluation of: Cough Delsym, tessalon, cough syrup and the coughing will not stop.  Hycodan helps for a little while, but it comes back in the middle of the night and will wake her up coughing.  She tells me she is exhausted.  She tells me she is not bringing anything up with the cough. She denies any fevers.   PMH, Medications, and Allergies reviewed and updated in chart as appropriate.   ROS negative except for what is listed in HPI. Objective:  BP 128/80   Pulse 84   Temp 98.2 F (36.8 C)   Wt 130 lb 12.8 oz (59.3 kg)   BMI 21.77 kg/m  Physical Exam        Assessment & Plan:   Problem List Items Addressed This Visit   None     Orma Render, DNP, AGNP-c 02/12/2022  2:37 PM    History, Medications, Surgery, SDOH, and Family History reviewed and updated as appropriate.

## 2022-02-12 NOTE — Patient Instructions (Signed)
We have given you a shot today of methylprednisolone '40mg'$  to help reduce the inflammation in your lungs. You may start the prednisone taper tomorrow.   I sent in a second z-pack for you. Please wait a day or two to see if you improve with just the steroid before starting this.   I have sent in albuterol, this should help with the cough and hopefully help you rest a little better.

## 2022-02-13 DIAGNOSIS — Z20828 Contact with and (suspected) exposure to other viral communicable diseases: Secondary | ICD-10-CM | POA: Insufficient documentation

## 2022-02-13 DIAGNOSIS — G4733 Obstructive sleep apnea (adult) (pediatric): Secondary | ICD-10-CM | POA: Diagnosis not present

## 2022-02-13 HISTORY — DX: Contact with and (suspected) exposure to other viral communicable diseases: Z20.828

## 2022-02-13 MED ORDER — OSELTAMIVIR PHOSPHATE 75 MG PO CAPS: 75.0000 mg | ORAL_CAPSULE | Freq: Every day | ORAL | 0 refills | Status: DC

## 2022-02-13 NOTE — Assessment & Plan Note (Signed)
Ongoing cough following upper respiratory infection about 10 days ago. She has not had any success with cough medications with exception of a few hours of rest with hycodan use at night. Wheezing is present bilaterally with mild rhonchus in the bases. Will begin treatment with steroid injection today followed by 5 day burst, albuterol, and azithromycin. She may need CXR if her symptoms are not improved by next week. No alarm sx present at this time, she is oxygenating well, which is reassuring.

## 2022-02-13 NOTE — Assessment & Plan Note (Signed)
Following the visit I was notified that the patients grandson came home from school with fever, chills, and tested positive for the flu. Will send treatment with tamiflu prophylactic for treatment.

## 2022-02-20 NOTE — Assessment & Plan Note (Signed)
COVID and flu tests are negative in the office today.  Given her current symptoms we will go ahead and start her on azithromycin and add Hycodan and Tessalon for the cough.  Do recommend that she use her albuterol inhaler.  We will also consider addition of prednisone if her respiratory symptoms worsen or fail to improve.

## 2022-02-22 DIAGNOSIS — R400 Somnolence: Secondary | ICD-10-CM | POA: Diagnosis not present

## 2022-02-22 DIAGNOSIS — G4733 Obstructive sleep apnea (adult) (pediatric): Secondary | ICD-10-CM | POA: Diagnosis not present

## 2022-02-23 DIAGNOSIS — R400 Somnolence: Secondary | ICD-10-CM | POA: Diagnosis not present

## 2022-02-23 DIAGNOSIS — G4733 Obstructive sleep apnea (adult) (pediatric): Secondary | ICD-10-CM | POA: Diagnosis not present

## 2022-03-26 DIAGNOSIS — G4733 Obstructive sleep apnea (adult) (pediatric): Secondary | ICD-10-CM | POA: Diagnosis not present

## 2022-03-26 DIAGNOSIS — R400 Somnolence: Secondary | ICD-10-CM | POA: Diagnosis not present

## 2022-03-28 ENCOUNTER — Telehealth: Payer: Self-pay | Admitting: Nurse Practitioner

## 2022-03-28 DIAGNOSIS — R059 Cough, unspecified: Secondary | ICD-10-CM

## 2022-03-28 NOTE — Telephone Encounter (Signed)
Fax from [facility]  Albuterol hfa inhaler

## 2022-03-29 MED ORDER — ALBUTEROL SULFATE HFA 108 (90 BASE) MCG/ACT IN AERS: 1.0000 | INHALATION_SPRAY | Freq: Four times a day (QID) | RESPIRATORY_TRACT | 1 refills | Status: DC | PRN

## 2022-03-29 NOTE — Telephone Encounter (Signed)
Refills sent to pharmacy on file for requested medications.

## 2022-04-12 DIAGNOSIS — H31012 Macula scars of posterior pole (postinflammatory) (post-traumatic), left eye: Secondary | ICD-10-CM | POA: Diagnosis not present

## 2022-04-12 DIAGNOSIS — H524 Presbyopia: Secondary | ICD-10-CM | POA: Diagnosis not present

## 2022-04-12 DIAGNOSIS — H2511 Age-related nuclear cataract, right eye: Secondary | ICD-10-CM | POA: Diagnosis not present

## 2022-04-12 DIAGNOSIS — H40052 Ocular hypertension, left eye: Secondary | ICD-10-CM | POA: Diagnosis not present

## 2022-04-12 DIAGNOSIS — H25812 Combined forms of age-related cataract, left eye: Secondary | ICD-10-CM | POA: Diagnosis not present

## 2022-04-26 DIAGNOSIS — R400 Somnolence: Secondary | ICD-10-CM | POA: Diagnosis not present

## 2022-04-26 DIAGNOSIS — G4733 Obstructive sleep apnea (adult) (pediatric): Secondary | ICD-10-CM | POA: Diagnosis not present

## 2022-05-01 ENCOUNTER — Encounter: Payer: Self-pay | Admitting: Nurse Practitioner

## 2022-05-01 ENCOUNTER — Ambulatory Visit (INDEPENDENT_AMBULATORY_CARE_PROVIDER_SITE_OTHER): Payer: Medicare HMO | Admitting: Nurse Practitioner

## 2022-05-01 VITALS — BP 120/74 | HR 73 | Wt 127.4 lb

## 2022-05-01 DIAGNOSIS — M2550 Pain in unspecified joint: Secondary | ICD-10-CM | POA: Diagnosis not present

## 2022-05-01 DIAGNOSIS — Z9882 Breast implant status: Secondary | ICD-10-CM

## 2022-05-01 DIAGNOSIS — R5383 Other fatigue: Secondary | ICD-10-CM

## 2022-05-01 DIAGNOSIS — R053 Chronic cough: Secondary | ICD-10-CM | POA: Diagnosis not present

## 2022-05-01 DIAGNOSIS — H04123 Dry eye syndrome of bilateral lacrimal glands: Secondary | ICD-10-CM

## 2022-05-01 NOTE — Patient Instructions (Addendum)
I have sent the referral to plastic surgery discuss options and insurance.   Let's see what your autoimmune markers show and we will see if we can find a correlation.

## 2022-05-01 NOTE — Progress Notes (Unsigned)
Orma Render, DNP, AGNP-c [place], [place] [address] [phone]  Subjective:   Kristin Gentry is a 72 y.o. female presents to day for evaluation of: The patient presents today with a constellation of symptoms including ongoing fatigue, muscle aches, gastrointestinal issues, and sleep disturbances. She expresses concern that her symptoms may be related to breast implant illness. The patient has no history of breast cancer and has been diligent with regular mammograms. She does have breast augmentation with saline implants. She has not had any adverse effects from these. She is considering removal as a potential solution to her symptoms and is seeking information about insurance coverage through Medicare.  Her medical history is significant for a prior COVID-19 infection, which she notes has been followed by persistent fatigue. She is currently being treated with levothyroxine for thyroid issues and has previously experienced high calcium levels. Additionally, she reports experiencing dry mouth, which she believes is a consequence of using her CPAP machine, and dry eyes for which she uses frequent eye drops.  The patient also reports a persistent cough since December, which has been resistant to treatments including steroid shots and antibiotics. She describes occasional drainage and the production of yellowish mucus. She is an ex-smoker, having quit 20-30 years ago, and denies any history of asthma.  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   PMH, Medications, and Allergies reviewed and updated in chart as appropriate.   ROS negative except for what is listed in HPI. Objective:  BP 120/74   Pulse 73   Wt 127 lb 6.4 oz (57.8 kg)   BMI 21.20 kg/m  Physical Exam Vitals and nursing note reviewed.  Constitutional:      General: She is not  in acute distress.    Appearance: Normal appearance.  HENT:     Head: Normocephalic.  Eyes:     Extraocular Movements: Extraocular movements intact.     Conjunctiva/sclera: Conjunctivae normal.     Pupils: Pupils are equal, round, and reactive to light.  Neck:     Vascular: No carotid bruit.  Cardiovascular:     Rate and Rhythm: Normal rate and regular rhythm.     Pulses: Normal pulses.     Heart sounds: Normal heart sounds. No murmur heard. Pulmonary:     Effort: Pulmonary effort is normal.     Breath sounds: Normal breath sounds. No wheezing.  Abdominal:     General: Bowel sounds are normal. There is no distension.     Palpations: Abdomen is soft.     Tenderness: There is no abdominal tenderness. There is no guarding.  Musculoskeletal:        General: Normal range of motion.     Cervical back: Normal range of motion and neck supple.     Right lower leg: No edema.     Left lower leg: No edema.  Lymphadenopathy:     Cervical: No cervical adenopathy.  Skin:    General: Skin is warm and dry.     Capillary Refill: Capillary refill takes less than 2 seconds.  Neurological:     General: No focal deficit present.     Mental Status: She is alert and oriented to person, place, and time.  Psychiatric:        Mood and Affect: Mood normal.        Behavior: Behavior normal.  Assessment & Plan:   Problem List Items Addressed This Visit     Low energy    Significant decline in energy levels with need for rests, which is not typical. Symptoms started with illness in December and have persisted. At this time, etiology is unclear. Consider possible post-viral fatigue vs. Autoimmune disorder.  Plan: - Monitor labs today for further evaluation.        Relevant Orders   ANA w/Reflex if Positive (Completed)   RA Qn+CCP(IgG/A)+SjoSSA+SjoSSB (Completed)   Ambulatory referral to Plastic Surgery   Arthralgia    The patient experiences ongoing fatigue and muscle aches,  impacting daily activities. Concerns are raised about the potential for breast implant illness due to a history of breast implants. Many of her symptoms align with autoimmune/connective tissue disorder.  Plan: - Conduct tests for auto-immune/CT disorder. - Contemplate a rheumatology referral if test results are positive or if symptoms continue. - Referral for plastic surgery placed to discuss Breast Implant Illness and possibility of implant removal.        Relevant Orders   ANA w/Reflex if Positive (Completed)   RA Qn+CCP(IgG/A)+SjoSSA+SjoSSB (Completed)   Ambulatory referral to Plastic Surgery   Unexplained chronic cough - Primary    The patient reports persistent coughing and occasional wheezing since URI in December. She has a past smoking history but has been smoke-free for 20-30 years. There are no systemic illness symptoms present. Her lungs are clear on exam today. Plan: - Will obtain labs for evaluation today.  - Consider spirometry to assess lung function and investigate potential asthma or other respiratory conditions. - Consider a pulmonology referral if indicated by findings.      Relevant Orders   ANA w/Reflex if Positive (Completed)   RA Qn+CCP(IgG/A)+SjoSSA+SjoSSB (Completed)   Ambulatory referral to Plastic Surgery   Chronic dryness of both eyes    Chronic dry eyes with other associated symptoms that appear to be autoimmune in nature.  Plan: - Labs for CT/autoimmune disorders today      Relevant Orders   ANA w/Reflex if Positive (Completed)   RA Qn+CCP(IgG/A)+SjoSSA+SjoSSB (Completed)   Ambulatory referral to Plastic Surgery   History of breast implant    The patient expresses concerns about breast implant illness and is amenable to implant removal if it could alleviate symptoms. At this time, it is unclear if the breast implants are contributing. Review of literature shows inconsistent consensus.  Plan: - Consult with a plastic surgeon regarding the patient's  concerns and explore insurance coverage for implant removal. - Evaluate the necessity of a referral for implant removal if autoimmune markers are positive or symptoms persist.      Relevant Orders   ANA w/Reflex if Positive (Completed)   RA Qn+CCP(IgG/A)+SjoSSA+SjoSSB (Completed)   Ambulatory referral to Vernon E Clydell Alberts, DNP, AGNP-c 05/02/2022  5:37 PM    History, Medications, Surgery, SDOH, and Family History reviewed and updated as appropriate.

## 2022-05-02 DIAGNOSIS — Z9882 Breast implant status: Secondary | ICD-10-CM | POA: Insufficient documentation

## 2022-05-02 DIAGNOSIS — H04123 Dry eye syndrome of bilateral lacrimal glands: Secondary | ICD-10-CM | POA: Insufficient documentation

## 2022-05-02 DIAGNOSIS — R5383 Other fatigue: Secondary | ICD-10-CM | POA: Insufficient documentation

## 2022-05-02 DIAGNOSIS — R053 Chronic cough: Secondary | ICD-10-CM | POA: Insufficient documentation

## 2022-05-02 DIAGNOSIS — M2550 Pain in unspecified joint: Secondary | ICD-10-CM | POA: Insufficient documentation

## 2022-05-02 HISTORY — DX: Chronic cough: R05.3

## 2022-05-02 LAB — ANA W/REFLEX IF POSITIVE
Anti Nuclear Antibody (ANA): POSITIVE — AB
ENA RNP Ab: <0.2 AI (ref 0.0–0.9)
ENA SM Ab Ser-aCnc: <0.2 AI (ref 0.0–0.9)
Scleroderma (Scl-70) (ENA) Antibody, IgG: 1.8 AI — ABNORMAL HIGH (ref 0.0–0.9)
dsDNA Ab: 4 IU/mL (ref 0–9)

## 2022-05-02 LAB — RA QN+CCP(IGG/A)+SJOSSA+SJOSSB: ENA SSB (LA) Ab: <0.2 AI (ref 0.0–0.9)

## 2022-05-02 NOTE — Assessment & Plan Note (Signed)
Significant decline in energy levels with need for rests, which is not typical. Symptoms started with illness in December and have persisted. At this time, etiology is unclear. Consider possible post-viral fatigue vs. Autoimmune disorder.  Plan: - Monitor labs today for further evaluation.

## 2022-05-02 NOTE — Assessment & Plan Note (Signed)
The patient expresses concerns about breast implant illness and is amenable to implant removal if it could alleviate symptoms. At this time, it is unclear if the breast implants are contributing. Review of literature shows inconsistent consensus.  Plan: - Consult with a plastic surgeon regarding the patient's concerns and explore insurance coverage for implant removal. - Evaluate the necessity of a referral for implant removal if autoimmune markers are positive or symptoms persist.

## 2022-05-02 NOTE — Assessment & Plan Note (Signed)
The patient experiences ongoing fatigue and muscle aches, impacting daily activities. Concerns are raised about the potential for breast implant illness due to a history of breast implants. Many of her symptoms align with autoimmune/connective tissue disorder.  Plan: - Conduct tests for auto-immune/CT disorder. - Contemplate a rheumatology referral if test results are positive or if symptoms continue. - Referral for plastic surgery placed to discuss Breast Implant Illness and possibility of implant removal.

## 2022-05-02 NOTE — Assessment & Plan Note (Signed)
The patient reports persistent coughing and occasional wheezing since URI in December. She has a past smoking history but has been smoke-free for 20-30 years. There are no systemic illness symptoms present. Her lungs are clear on exam today. Plan: - Will obtain labs for evaluation today.  - Consider spirometry to assess lung function and investigate potential asthma or other respiratory conditions. - Consider a pulmonology referral if indicated by findings.

## 2022-05-02 NOTE — Assessment & Plan Note (Signed)
Chronic dry eyes with other associated symptoms that appear to be autoimmune in nature.  Plan: - Labs for CT/autoimmune disorders today

## 2022-05-03 LAB — ANA W/REFLEX IF POSITIVE
Anti JO-1: <0.2 AI (ref 0.0–0.9)
Centromere Ab Screen: <0.2 AI (ref 0.0–0.9)
Chromatin Ab SerPl-aCnc: <0.2 AI (ref 0.0–0.9)

## 2022-05-03 LAB — RA QN+CCP(IGG/A)+SJOSSA+SJOSSB
ENA SSA (RO) Ab: <0.2 AI (ref 0.0–0.9)
Rhuematoid fact SerPl-aCnc: 13.5 IU/mL (ref <14.0)

## 2022-05-16 ENCOUNTER — Telehealth: Payer: Self-pay

## 2022-05-16 NOTE — Telephone Encounter (Signed)
Pt. Called stating she is due for her Prolia now and Emeterio Reeve told her we could handle getting it ordered. Juliann Pulse is aware to now to start working on her Prolia PA and scheduling and ordering.

## 2022-05-17 NOTE — Telephone Encounter (Signed)
We have no records in system concerning Prolia. I called pt and left a message advising that she needs to sign a medical records request to have information sent over. I do not even know if you are aware pt ever had Prolia. More information will need to be in chart before Prolia can move forward for approval. Please advise if you approve to move forward pt being on Prolia.

## 2022-05-17 NOTE — Telephone Encounter (Signed)
Yes, please proceed with this. I believe she was previously with [facility] physicians and we do not have access to their records.  Do you need documentation from me? I will likely need to wait until the records are in to do this, but will be happy to assist however I can.

## 2022-05-25 DIAGNOSIS — G4733 Obstructive sleep apnea (adult) (pediatric): Secondary | ICD-10-CM | POA: Diagnosis not present

## 2022-05-25 DIAGNOSIS — R400 Somnolence: Secondary | ICD-10-CM | POA: Diagnosis not present

## 2022-05-29 ENCOUNTER — Telehealth: Payer: Self-pay | Admitting: Nurse Practitioner

## 2022-05-29 NOTE — Telephone Encounter (Signed)
Called patient to schedule Medicare Annual Wellness Visit (AWV). Left message for patient to call back and schedule Medicare Annual Wellness Visit (AWV).  Last date of AWV: awvi 11/26/17 per [facility]   Please schedule an appointment at any time with [facility].  If any questions, please contact me at [phone].  Thank you ,  Shirlean Mylar

## 2022-06-04 ENCOUNTER — Encounter: Payer: Self-pay | Admitting: Plastic Surgery

## 2022-06-04 ENCOUNTER — Ambulatory Visit: Payer: Medicare HMO | Admitting: Plastic Surgery

## 2022-06-04 VITALS — BP 133/84 | HR 77 | Ht 65.0 in | Wt 127.6 lb

## 2022-06-04 DIAGNOSIS — R0789 Other chest pain: Secondary | ICD-10-CM

## 2022-06-04 DIAGNOSIS — Z9882 Breast implant status: Secondary | ICD-10-CM | POA: Diagnosis not present

## 2022-06-04 DIAGNOSIS — M549 Dorsalgia, unspecified: Secondary | ICD-10-CM

## 2022-06-04 NOTE — Progress Notes (Signed)
Referring Provider Early, Sung Amabile, NP [address] [place],  [place] [address]   CC:  Chief Complaint  Patient presents with   Consult      [name] is an 72 y.o. female.  HPI: Ms. Kristin Gentry is a 72 year old female who had implants placed approximately 13 years ago.  Since that time she has had discomfort mostly with taking deep breaths.  Recently she has noticed an increase in vague symptoms such as dry eyes and fatigue.  Wondering if this is related to her implants.  She is requesting removal of her implants without replacement.  No Known Allergies  Outpatient Encounter Medications as of 06/04/2022  Medication Sig   calcium carbonate (OS-CAL) 600 MG TABS tablet Take 600 mg by mouth 2 (two) times daily with a meal.   cholecalciferol (VITAMIN D) 1000 UNITS tablet Take 1,000 Units by mouth every morning.    HYDROcodone bit-homatropine (HYCODAN) 5-1.5 MG/5ML syrup Take 5 mLs by mouth every 8 (eight) hours as needed for cough.   levothyroxine (SYNTHROID, LEVOTHROID) 88 MCG tablet Take 88 mcg by mouth daily before breakfast.   oseltamivir (TAMIFLU) 75 MG capsule Take 1 capsule (75 mg total) by mouth daily.   Polyethyl Glycol-Propyl Glycol (SYSTANE) 0.4-0.3 % SOLN Place 1 drop into both eyes daily.   predniSONE (DELTASONE) 20 MG tablet Take 60mg  PO daily x 2 days, then40mg  PO daily x 2 days, then 20mg  PO daily x 3 days   [DISCONTINUED] acetaminophen (TYLENOL) 500 MG tablet Take 1,000 mg by mouth every 6 (six) hours as needed for headache. (Patient not taking: Reported on 02/12/2022)   [DISCONTINUED] albuterol (VENTOLIN HFA) 108 (90 Base) MCG/ACT inhaler Inhale 1-2 puffs into the lungs every 6 (six) hours as needed for wheezing (cough).   [DISCONTINUED] benzonatate (TESSALON) 200 MG capsule Take 1 capsule (200 mg total) by mouth 3 (three) times daily as needed for cough. (Patient not taking: Reported on 05/01/2022)   No facility-administered encounter medications on file as of  06/04/2022.     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   Thyroid disease    hypothyroid    Past Surgical History:  Procedure Laterality Date   AUGMENTATION MAMMAPLASTY  07/2005   --saline   CESAREAN SECTION  1986   COLONOSCOPY WITH PROPOFOL N/A 11/12/2013   Procedure: COLONOSCOPY WITH PROPOFOL;  Surgeon: Charolett Bumpers, MD;  Location: [facility];  Service: Endoscopy;  Laterality: N/A;   GYNECOLOGIC CRYOSURGERY  1989   of cervix   LAPAROSCOPIC APPENDECTOMY N/A 07/16/2017   Procedure: APPENDECTOMY LAPAROSCOPIC;  Surgeon: Darnell Level, MD;  Location: [facility];  Service: General;  Laterality: N/A;   Total Vaginal Hysterectomy/BSO  2001   --fibroids    Family History  Problem Relation Age of Onset   Hypertension Mother    Heart disease Mother    Thyroid disease Mother        hypothyroid   Diabetes Father    Hypertension Father    Heart disease Father    Thyroid disease Sister        hypothyroid    Social History   Social History Narrative   Not on file     Review of  Systems General: Denies fevers, chills, weight loss CV: Denies chest pain, shortness of breath, palpitations Breast: Large implants placed through the axillary approach  Physical Exam    06/04/2022   11:04 AM 05/01/2022    9:02 AM 02/12/2022    1:52 PM  Vitals with BMI  Height 5\' 5"     Weight 127 lbs 10 oz 127 lbs 6 oz 130 lbs 13 oz  BMI 21.23    Systolic 133 120 668  Diastolic 84 74 80  Pulse 77 73 84    General:  No acute distress,  Alert and oriented, Non-Toxic, Normal speech and affect Breast: By mammogram the patient has saline implants.  They are very large but symmetric with a nice shape.  She has minimal to no capsule.  There are no masses. Mammogram: Mammogram from 2014 shows  submuscular saline implants.  Will obtain patient's most recent mammogram results prior to surgery. Assessment/Plan Breast implants: We discussed breast implant illness at length.  I am one of the surgeons and the majority who do not believe in breast implant illness.  And I do not tell my patients that they will have improvement in any systemic symptoms with removal of the implants.  That said the patient has tightness of the chest and discomfort in the chest and back from the size of her implants and I believe this is an indication for removal of the breast implants.  We discussed removal at length including the location of the scars which would be just above the inframammary fold, the use of drains to prevent seroma.  And limited capsulectomy to help prevent ongoing seromas.  The patient would like to have her implants removed and is requested that I submit her for consideration.  Photographs were obtained today with her consent.  Santiago Glad 06/04/2022, 11:42 AM

## 2022-06-06 ENCOUNTER — Other Ambulatory Visit: Payer: Self-pay

## 2022-06-06 DIAGNOSIS — R768 Other specified abnormal immunological findings in serum: Secondary | ICD-10-CM

## 2022-06-08 DIAGNOSIS — G4733 Obstructive sleep apnea (adult) (pediatric): Secondary | ICD-10-CM | POA: Diagnosis not present

## 2022-06-08 DIAGNOSIS — R400 Somnolence: Secondary | ICD-10-CM | POA: Diagnosis not present

## 2022-06-15 ENCOUNTER — Telehealth: Payer: Self-pay | Admitting: Nurse Practitioner

## 2022-06-15 NOTE — Telephone Encounter (Signed)
Contacted Georgeanna Harrison to schedule their annual wellness visit. Appointment made for 06/26/22.  Rudell Cobb AWV direct phone # [phone]

## 2022-06-19 ENCOUNTER — Telehealth: Payer: Self-pay | Admitting: Nurse Practitioner

## 2022-06-19 NOTE — Telephone Encounter (Signed)
Records received from [facility].

## 2022-06-19 NOTE — Telephone Encounter (Signed)
Pt called concerning Prolia pt advised estimated cost is $317. PA was approved and noted on appt. PT made an appt for 07/05/2022. Medication ordered from physician services.

## 2022-06-25 DIAGNOSIS — G4733 Obstructive sleep apnea (adult) (pediatric): Secondary | ICD-10-CM | POA: Diagnosis not present

## 2022-06-25 DIAGNOSIS — R400 Somnolence: Secondary | ICD-10-CM | POA: Diagnosis not present

## 2022-06-26 ENCOUNTER — Ambulatory Visit (INDEPENDENT_AMBULATORY_CARE_PROVIDER_SITE_OTHER): Payer: Medicare HMO

## 2022-06-26 VITALS — Ht 64.0 in | Wt 125.0 lb

## 2022-06-26 DIAGNOSIS — Z0000 Encounter for general adult medical examination without abnormal findings: Secondary | ICD-10-CM

## 2022-06-26 NOTE — Progress Notes (Signed)
I connected with  Georgeanna Harrison on 06/26/22 by a audio enabled telemedicine application and verified that I am speaking with the correct person using two identifiers.  Patient Location: Home  Provider Location: Office/Clinic  I discussed the limitations of evaluation and management by telemedicine. The patient expressed understanding and agreed to proceed.  Subjective:   Kristin Gentry is a 72 y.o. female who presents for an Initial Medicare Annual Wellness Visit.  Review of Systems     Cardiac Risk Factors include: advanced age (>33men, >84 women)     Objective:    Today's Vitals   06/26/22 0911  Weight: 125 lb (56.7 kg)  Height: 5\' 4"  (1.626 m)   Body mass index is 21.46 kg/m.     06/26/2022    9:17 AM 07/21/2017    3:00 PM 07/16/2017    5:23 PM 11/12/2013   10:05 AM 11/03/2013    3:31 PM  Advanced Directives  Does Patient Have a Medical Advance Directive? Yes No Yes Yes Yes  Type of Estate agent of [place];Living will  Healthcare Power of [place];Living will Living will Living will  Does patient want to make changes to medical advance directive?  No - Patient declined No - Patient declined  No - Patient declined  Copy of Healthcare Power of Attorney in Chart? No - copy requested  No - copy requested  No - copy requested  Would patient like information on creating a medical advance directive?  No - Patient declined       Current Medications (verified) Outpatient Encounter Medications as of 06/26/2022  Medication Sig   calcium carbonate (OS-CAL) 600 MG TABS tablet Take 600 mg by mouth 2 (two) times daily with a meal.   cholecalciferol (VITAMIN D) 1000 UNITS tablet Take 1,000 Units by mouth every morning.    latanoprost (XALATAN) 0.005 % ophthalmic solution 1 drop at bedtime.   levothyroxine (SYNTHROID, LEVOTHROID) 88 MCG tablet Take 88 mcg by mouth daily before breakfast.   Polyethyl Glycol-Propyl Glycol (SYSTANE) 0.4-0.3 % SOLN Place 1  drop into both eyes daily.   HYDROcodone bit-homatropine (HYCODAN) 5-1.5 MG/5ML syrup Take 5 mLs by mouth every 8 (eight) hours as needed for cough. (Patient not taking: Reported on 06/26/2022)   oseltamivir (TAMIFLU) 75 MG capsule Take 1 capsule (75 mg total) by mouth daily. (Patient not taking: Reported on 06/26/2022)   predniSONE (DELTASONE) 20 MG tablet Take 60mg  PO daily x 2 days, then40mg  PO daily x 2 days, then 20mg  PO daily x 3 days (Patient not taking: Reported on 06/26/2022)   No facility-administered encounter medications on file as of 06/26/2022.    Allergies (verified) Patient has no known allergies.   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   Thyroid disease    hypothyroid   Past Surgical History:  Procedure Laterality Date   AUGMENTATION MAMMAPLASTY  07/2005   --saline   CESAREAN SECTION  1986   COLONOSCOPY WITH PROPOFOL N/A 11/12/2013   Procedure: COLONOSCOPY WITH PROPOFOL;  Surgeon: Charolett Bumpers, MD;  [facility]: [facility];  Service: Endoscopy;  Laterality: N/A;   GYNECOLOGIC CRYOSURGERY  1989   of cervix   LAPAROSCOPIC APPENDECTOMY N/A 07/16/2017  Procedure: APPENDECTOMY LAPAROSCOPIC;  Surgeon: Darnell Level, MD;  Location: [facility];  Service: General;  Laterality: N/A;   Total Vaginal Hysterectomy/BSO  2001   --fibroids   Family History  Problem Relation Age of Onset   Hypertension Mother    Heart disease Mother    Thyroid disease Mother        hypothyroid   Diabetes Father    Hypertension Father    Heart disease Father    Thyroid disease Sister        hypothyroid   Social History   Socioeconomic History   Marital status: Divorced    Spouse name: Not on file   Number of children: Not on file   Years of education: Not on file    Highest education level: Not on file  Occupational History   Not on file  Tobacco Use   Smoking status: Former    Packs/day: 0.50    Years: 10.00    Additional pack years: 0.00    Total pack years: 5.00    Types: Cigarettes    Quit date: 02/27/2003    Years since quitting: 19.3   Smokeless tobacco: Never  Vaping Use   Vaping Use: Never used  Substance and Sexual Activity   Alcohol use: Not Currently    Alcohol/week: 2.0 standard drinks of alcohol    Types: 2 drink(s) per week    Comment: socially   Drug use: No   Sexual activity: Not on file  Other Topics Concern   Not on file  Social History Narrative   Not on file   Social Determinants of Health   Financial Resource Strain: Low Risk  (06/26/2022)   Overall Financial Resource Strain (CARDIA)    Difficulty of Paying Living Expenses: Not hard at all  Food Insecurity: No Food Insecurity (06/26/2022)   Hunger Vital Sign    Worried About Running Out of Food in the Last Year: Never true    Ran Out of Food in the Last Year: Never true  Transportation Needs: No Transportation Needs (06/26/2022)   PRAPARE - Administrator, Civil Service (Medical): No    Lack of Transportation (Non-Medical): No  Physical Activity: Sufficiently Active (06/26/2022)   Exercise Vital Sign    Days of Exercise per Week: 7 days    Minutes of Exercise per Session: 60 min  Stress: No Stress Concern Present (06/26/2022)   [employer] of Occupational Health - Occupational Stress Questionnaire    Feeling of Stress : Not at all  Social Connections: Not on file    Tobacco Counseling Counseling given: Not Answered   Clinical Intake:  Pre-visit preparation completed: Yes  Pain : No/denies pain     Nutritional Status: BMI of 19-24  Normal Nutritional Risks: None Diabetes: No  How often do you need to have someone help you when you read instructions, pamphlets, or other written materials from your doctor or pharmacy?: 1 -  Never  Diabetic? no  Interpreter Needed?: No  Information entered by :: NAllen LPN   Activities of Daily Living    06/26/2022    9:18 AM  In your present state of health, do you have any difficulty performing the following activities:  Hearing? 0  Vision? 1  Comment blind in 1 eye  Difficulty concentrating or making decisions? 0  Walking or climbing stairs? 0  Dressing or bathing? 0  Doing errands, shopping? 0  Preparing Food and eating ? N  Using the Toilet? N  In the past six months, have you accidently leaked urine? N  Do you have problems with loss of bowel control? N  Managing your Medications? N  Managing your Finances? N  Housekeeping or managing your Housekeeping? N    Patient Care Team: Early, Sung Amabile, NP as PCP - General (Nurse Practitioner)  Indicate any recent Medical Services you may have received from other than [facility] providers in the past year (date may be approximate).     Assessment:   This is a routine wellness examination for Sch[place].  Hearing/Vision screen Vision Screening - Comments:: Regular eye exams, [facility]  Dietary issues and exercise activities discussed:     Goals Addressed             This Visit's Progress    Patient Stated       06/26/2022, wants to be able to dance at daughter's wedding and lose 5 pounds       Depression Screen    06/26/2022    9:17 AM  PHQ 2/9 Scores  PHQ - 2 Score 0  PHQ- 9 Score 0    Fall Risk    06/26/2022    9:17 AM  Fall Risk   Falls in the past year? 0  Number falls in past yr: 0  Injury with Fall? 0  Risk for fall due to : Medication side effect  Follow up Falls prevention discussed;Education provided;Falls evaluation completed    FALL RISK PREVENTION PERTAINING TO THE HOME:  Any stairs in or around the home? Yes  If so, are there any without handrails? No  Home free of loose throw rugs in walkways, pet beds, electrical cords, etc? Yes  Adequate lighting in your home to reduce risk  of falls? Yes   ASSISTIVE DEVICES UTILIZED TO PREVENT FALLS:  Life alert? No  Use of a cane, walker or w/c? No  Grab bars in the bathroom? No  Shower chair or bench in shower? No  Elevated toilet seat or a handicapped toilet? No   TIMED UP AND GO:  Was the test performed? No .      Cognitive Function:        06/26/2022    9:20 AM  6CIT Screen  What Year? 0 points  What month? 0 points  What time? 0 points  Count back from 20 0 points  Months in reverse 0 points  Repeat phrase 0 points  Total Score 0 points    Immunizations Immunization History  Administered Date(s) Administered   Td 02/27/2008    TDAP status: Due, Education has been provided regarding the importance of this vaccine. Advised may receive this vaccine at local pharmacy or Health Dept. Aware to provide a copy of the vaccination record if obtained from local pharmacy or Health Dept. Verbalized acceptance and understanding.  Flu Vaccine status: Up to date  Pneumococcal vaccine status: Due, Education has been provided regarding the importance of this vaccine. Advised may receive this vaccine at local pharmacy or Health Dept. Aware to provide a copy of the vaccination record if obtained from local pharmacy or Health Dept. Verbalized acceptance and understanding.  Covid-19 vaccine status: Completed vaccines  Qualifies for Shingles Vaccine? Yes   Zostavax completed No   Shingrix Completed?: No.    Education has been provided regarding the importance of this vaccine. Patient has been advised to call insurance company to determine out of pocket expense if they have not yet received this vaccine. Advised may also receive vaccine at  local pharmacy or Health Dept. Verbalized acceptance and understanding.  Screening Tests Health Maintenance  Topic Date Due   Hepatitis C Screening  Never done   Zoster Vaccines- Shingrix (1 of 2) Never done   Pneumonia Vaccine 9+ Years old (1 of 1 - PCV) Never done    DTaP/Tdap/Td (2 - Tdap) 02/26/2018   COVID-19 Vaccine (5 - 2023-24 season) 10/27/2021   INFLUENZA VACCINE  09/27/2022   Medicare Annual Wellness (AWV)  11/21/2022   MAMMOGRAM  09/15/2023   COLONOSCOPY (Pts 45-53yrs Insurance coverage will need to be confirmed)  11/13/2023   DEXA SCAN  Completed   HPV VACCINES  Aged Out    Health Maintenance  Health Maintenance Due  Topic Date Due   Hepatitis C Screening  Never done   Zoster Vaccines- Shingrix (1 of 2) Never done   Pneumonia Vaccine 75+ Years old (1 of 1 - PCV) Never done   DTaP/Tdap/Td (2 - Tdap) 02/26/2018   COVID-19 Vaccine (5 - 2023-24 season) 10/27/2021    Colorectal cancer screening: believes has had more recently  Mammogram status: Completed 09/14/2021. Repeat every year  Bone Density status: Completed 06/20/2021.   Lung Cancer Screening: (Low Dose CT Chest recommended if Age 47-80 years, 30 pack-year currently smoking OR have quit w/in 15years.) does not qualify.   Lung Cancer Screening Referral: no  Additional Screening:  Hepatitis C Screening: does qualify;   Vision Screening: Recommended annual ophthalmology exams for early detection of glaucoma and other disorders of the eye. Is the patient up to date with their annual eye exam?  Yes  Who is the provider or what is the name of the office in which the patient attends annual eye exams? [facility] If pt is not established with a provider, would they like to be referred to a provider to establish care? No .   Dental Screening: Recommended annual dental exams for proper oral hygiene  Community Resource Referral / Chronic Care Management: CRR required this visit?  No   CCM required this visit?  No      Plan:     I have personally reviewed and noted the following in the patient's chart:   Medical and social history Use of alcohol, tobacco or illicit drugs  Current medications and supplements including opioid prescriptions. Patient is not currently taking opioid  prescriptions. Functional ability and status Nutritional status Physical activity Advanced directives List of other physicians Hospitalizations, surgeries, and ER visits in previous 12 months Vitals Screenings to include cognitive, depression, and falls Referrals and appointments  In addition, I have reviewed and discussed with patient certain preventive protocols, quality metrics, and best practice recommendations. A written personalized care plan for preventive services as well as general preventive health recommendations were provided to patient.     Barb Merino, LPN   [id number]   Nurse Notes: none  Due to this being a virtual visit, the after visit summary with patients personalized plan was offered to patient via mail or my-chart.  to pick up at office at next visit

## 2022-06-26 NOTE — Patient Instructions (Signed)
Kristin Gentry , Thank you for taking time to come for your Medicare Wellness Visit. I appreciate your ongoing commitment to your health goals. Please review the following plan we discussed and let me know if I can assist you in the future.   These are the goals we discussed:  Goals      Patient Stated     06/26/2022, wants to be able to dance at daughter's wedding and lose 5 pounds        This is a list of the screening recommended for you and due dates:  Health Maintenance  Topic Date Due   Hepatitis C Screening: USPSTF Recommendation to screen - Ages 45-79 yo.  Never done   Zoster (Shingles) Vaccine (1 of 2) Never done   Pneumonia Vaccine (1 of 1 - PCV) Never done   DTaP/Tdap/Td vaccine (2 - Tdap) 02/26/2018   COVID-19 Vaccine (5 - 2023-24 season) 10/27/2021   Flu Shot  09/27/2022   Medicare Annual Wellness Visit  06/26/2023   Mammogram  09/15/2023   Colon Cancer Screening  11/13/2023   DEXA scan (bone density measurement)  Completed   HPV Vaccine  Aged Out    Advanced directives: Please bring a copy of your POA (Power of Humph[place]) and/or Living Will to your next appointment.   Conditions/risks identified: none  Next appointment: Follow up in one year for your annual wellness visit    Preventive Care 65 Years and Older, Female Preventive care refers to lifestyle choices and visits with your health care provider that can promote health and wellness. What does preventive care include? A yearly physical exam. This is also called an annual well check. Dental exams once or twice a year. Routine eye exams. Ask your health care provider how often you should have your eyes checked. Personal lifestyle choices, including: Daily care of your teeth and gums. Regular physical activity. Eating a healthy diet. Avoiding tobacco and drug use. Limiting alcohol use. Practicing safe sex. Taking low-dose aspirin every day. Taking vitamin and mineral supplements as recommended by your  health care provider. What happens during an annual well check? The services and screenings done by your health care provider during your annual well check will depend on your age, overall health, lifestyle risk factors, and family history of disease. Counseling  Your health care provider may ask you questions about your: Alcohol use. Tobacco use. Drug use. Emotional well-being. Home and relationship well-being. Sexual activity. Eating habits. History of falls. Memory and ability to understand (cognition). Work and work Astronomer. Reproductive health. Screening  You may have the following tests or measurements: Height, weight, and BMI. Blood pressure. Lipid and cholesterol levels. These may be checked every 5 years, or more frequently if you are over 57 years old. Skin check. Lung cancer screening. You may have this screening every year starting at age 81 if you have a 30-pack-year history of smoking and currently smoke or have quit within the past 15 years. Fecal occult blood test (FOBT) of the stool. You may have this test every year starting at age 44. Flexible sigmoidoscopy or colonoscopy. You may have a sigmoidoscopy every 5 years or a colonoscopy every 10 years starting at age 43. Hepatitis C blood test. Hepatitis B blood test. Sexually transmitted disease (STD) testing. Diabetes screening. This is done by checking your blood sugar (glucose) after you have not eaten for a while (fasting). You may have this done every 1-3 years. Bone density scan. This is done to screen for  osteoporosis. You may have this done starting at age 31. Mammogram. This may be done every 1-2 years. Talk to your health care provider about how often you should have regular mammograms. Talk with your health care provider about your test results, treatment options, and if necessary, the need for more tests. Vaccines  Your health care provider may recommend certain vaccines, such as: Influenza vaccine.  This is recommended every year. Tetanus, diphtheria, and acellular pertussis (Tdap, Td) vaccine. You may need a Td booster every 10 years. Zoster vaccine. You may need this after age 82. Pneumococcal 13-valent conjugate (PCV13) vaccine. One dose is recommended after age 69. Pneumococcal polysaccharide (PPSV23) vaccine. One dose is recommended after age 72. Talk to your health care provider about which screenings and vaccines you need and how often you need them. This information is not intended to replace advice given to you by your health care provider. Make sure you discuss any questions you have with your health care provider. Document Released: 03/11/2015 Document Revised: 11/02/2015 Document Reviewed: 12/14/2014 Elsevier Interactive Patient Education  2017 [place] Prevention in the Home Falls can cause injuries. They can happen to people of all ages. There are many things you can do to make your home safe and to help prevent falls. What can I do on the outside of my home? Regularly fix the edges of walkways and driveways and fix any cracks. Remove anything that might make you trip as you walk through a door, such as a raised step or threshold. Trim any bushes or trees on the path to your home. Use bright outdoor lighting. Clear any walking paths of anything that might make someone trip, such as rocks or tools. Regularly check to see if handrails are loose or broken. Make sure that both sides of any steps have handrails. Any raised decks and porches should have guardrails on the edges. Have any leaves, snow, or ice cleared regularly. Use sand or salt on walking paths during winter. Clean up any spills in your garage right away. This includes oil or grease spills. What can I do in the bathroom? Use night lights. Install grab bars by the toilet and in the tub and shower. Do not use towel bars as grab bars. Use non-skid mats or decals in the tub or shower. If you need to sit  down in the shower, use a plastic, non-slip stool. Keep the floor dry. Clean up any water that spills on the floor as soon as it happens. Remove soap buildup in the tub or shower regularly. Attach bath mats securely with double-sided non-slip rug tape. Do not have throw rugs and other things on the floor that can make you trip. What can I do in the bedroom? Use night lights. Make sure that you have a light by your bed that is easy to reach. Do not use any sheets or blankets that are too big for your bed. They should not hang down onto the floor. Have a firm chair that has side arms. You can use this for support while you get dressed. Do not have throw rugs and other things on the floor that can make you trip. What can I do in the kitchen? Clean up any spills right away. Avoid walking on wet floors. Keep items that you use a lot in easy-to-reach places. If you need to reach something above you, use a strong step stool that has a grab bar. Keep electrical cords out of the way. Do not  use floor polish or wax that makes floors slippery. If you must use wax, use non-skid floor wax. Do not have throw rugs and other things on the floor that can make you trip. What can I do with my stairs? Do not leave any items on the stairs. Make sure that there are handrails on both sides of the stairs and use them. Fix handrails that are broken or loose. Make sure that handrails are as long as the stairways. Check any carpeting to make sure that it is firmly attached to the stairs. Fix any carpet that is loose or worn. Avoid having throw rugs at the top or bottom of the stairs. If you do have throw rugs, attach them to the floor with carpet tape. Make sure that you have a light switch at the top of the stairs and the bottom of the stairs. If you do not have them, ask someone to add them for you. What else can I do to help prevent falls? Wear shoes that: Do not have high heels. Have rubber bottoms. Are  comfortable and fit you well. Are closed at the toe. Do not wear sandals. If you use a stepladder: Make sure that it is fully opened. Do not climb a closed stepladder. Make sure that both sides of the stepladder are locked into place. Ask someone to hold it for you, if possible. Clearly mark and make sure that you can see: Any grab bars or handrails. First and last steps. Where the edge of each step is. Use tools that help you move around (mobility aids) if they are needed. These include: Canes. Walkers. Scooters. Crutches. Turn on the lights when you go into a dark area. Replace any light bulbs as soon as they burn out. Set up your furniture so you have a clear path. Avoid moving your furniture around. If any of your floors are uneven, fix them. If there are any pets around you, be aware of where they are. Review your medicines with your doctor. Some medicines can make you feel dizzy. This can increase your chance of falling. Ask your doctor what other things that you can do to help prevent falls. This information is not intended to replace advice given to you by your health care provider. Make sure you discuss any questions you have with your health care provider. Document Released: 12/09/2008 Document Revised: 07/21/2015 Document Reviewed: 03/19/2014 Elsevier Interactive Patient Education  2017 [employer].

## 2022-06-27 ENCOUNTER — Encounter: Payer: Self-pay | Admitting: Nurse Practitioner

## 2022-07-05 ENCOUNTER — Other Ambulatory Visit: Payer: Medicare HMO

## 2022-07-05 DIAGNOSIS — M810 Age-related osteoporosis without current pathological fracture: Secondary | ICD-10-CM

## 2022-07-05 MED ORDER — DENOSUMAB 60 MG/ML ~~LOC~~ SOSY
60.0000 mg | PREFILLED_SYRINGE | Freq: Once | SUBCUTANEOUS | Status: AC
  Administered 2022-07-05: 60 mg via SUBCUTANEOUS

## 2022-07-11 ENCOUNTER — Telehealth: Payer: Self-pay | Admitting: Nurse Practitioner

## 2022-07-11 MED ORDER — LEVOTHYROXINE SODIUM 75 MCG PO TABS: 75.0000 ug | ORAL_TABLET | Freq: Every day | ORAL | 0 refills | Status: DC

## 2022-07-11 NOTE — Telephone Encounter (Signed)
Prescription Request  07/11/2022  LOV: 05/01/2022  What is the name of the medication or equipment? levothyroxine   Have you contacted your pharmacy to request a refill? Yes   Which pharmacy would you like this sent to? [facility]   Patient notified that their request is being sent to the clinical staff for review and that they should receive a response within 2 business days.   Please advise at Mobile [phone] (mobile)

## 2022-07-11 NOTE — Telephone Encounter (Signed)
Left message for patient to call me back. I cannot find a TSH level in her chart. Not sure if she has one from somewhere else that one done that she can send over or if we need to get her in for a med check.

## 2022-07-11 NOTE — Telephone Encounter (Signed)
Sent!

## 2022-07-18 ENCOUNTER — Encounter: Payer: Self-pay | Admitting: Gastroenterology

## 2022-07-25 DIAGNOSIS — R400 Somnolence: Secondary | ICD-10-CM | POA: Diagnosis not present

## 2022-07-25 DIAGNOSIS — G4733 Obstructive sleep apnea (adult) (pediatric): Secondary | ICD-10-CM | POA: Diagnosis not present

## 2022-08-03 ENCOUNTER — Encounter: Payer: Self-pay | Admitting: Nurse Practitioner

## 2022-08-14 DIAGNOSIS — G4733 Obstructive sleep apnea (adult) (pediatric): Secondary | ICD-10-CM | POA: Diagnosis not present

## 2022-08-21 ENCOUNTER — Other Ambulatory Visit: Payer: Self-pay | Admitting: Nurse Practitioner

## 2022-08-21 DIAGNOSIS — Z1231 Encounter for screening mammogram for malignant neoplasm of breast: Secondary | ICD-10-CM

## 2022-08-25 DIAGNOSIS — G4733 Obstructive sleep apnea (adult) (pediatric): Secondary | ICD-10-CM | POA: Diagnosis not present

## 2022-08-25 DIAGNOSIS — R400 Somnolence: Secondary | ICD-10-CM | POA: Diagnosis not present

## 2022-08-28 ENCOUNTER — Ambulatory Visit (INDEPENDENT_AMBULATORY_CARE_PROVIDER_SITE_OTHER): Payer: Medicare HMO | Admitting: Nurse Practitioner

## 2022-08-28 ENCOUNTER — Encounter: Payer: Self-pay | Admitting: Nurse Practitioner

## 2022-08-28 VITALS — BP 110/78 | HR 80 | Temp 98.1°F | Resp 16 | Wt 119.8 lb

## 2022-08-28 DIAGNOSIS — K2100 Gastro-esophageal reflux disease with esophagitis, without bleeding: Secondary | ICD-10-CM | POA: Diagnosis not present

## 2022-08-28 DIAGNOSIS — F418 Other specified anxiety disorders: Secondary | ICD-10-CM

## 2022-08-28 MED ORDER — PANTOPRAZOLE SODIUM 40 MG PO TBEC: 40.0000 mg | DELAYED_RELEASE_TABLET | Freq: Every day | ORAL | 3 refills | Status: DC

## 2022-08-28 MED ORDER — ALPRAZOLAM 0.25 MG PO TABS
0.2500 mg | ORAL_TABLET | Freq: Two times a day (BID) | ORAL | 0 refills | Status: DC | PRN
Start: 2022-08-28 — End: 2022-10-21

## 2022-08-28 NOTE — Progress Notes (Signed)
Tollie Eth, DNP, AGNP-c [facility] [address] [place], [place] [address] [phone]   ACUTE VISIT- ESTABLISHED PATIENT  Blood pressure 110/78, pulse 80, temperature 98.1 F (36.7 C), temperature source Oral, resp. rate 16, weight 119 lb 12.8 oz (54.3 kg), SpO2 98%.  Subjective:  HPI AMIELA RENNER is a 72 y.o. female presents to day for evaluation of: chronic cough.   Faigy initially noted a dry cough following COVID in November/December of last year. She has had periodic episodes of flairs with the cough turning to a wet cough.  She initially tried the albuterol inhaler and this caused a coughing fit, but then it would stop for a period of time, but then it would come back. She notices it at bedtime and then again in the morning. She is using a cpap. She notices it when she is around animals and when she is not. It is the same in [place] as it is in [place].   She also mentions increased anxiety recently with family events. She does not feel that she needs something daily, but intermittently something could be helpful. No SI/HI  PMH, Medications, and Allergies reviewed and updated in chart as appropriate.   ROS negative except for what is listed in HPI. Objective:  Physical Exam Vitals and nursing note reviewed.  Constitutional:      Appearance: Normal appearance.  HENT:     Head: Normocephalic.     Mouth/Throat:     Mouth: Mucous membranes are moist.     Pharynx: Oropharynx is clear.  Eyes:     Conjunctiva/sclera: Conjunctivae normal.  Neck:     Vascular: No carotid bruit.  Cardiovascular:     Rate and Rhythm: Normal rate and regular rhythm.     Pulses: Normal pulses.     Heart sounds: Normal heart sounds.  Pulmonary:     Effort: Pulmonary effort is normal.     Breath sounds: No wheezing or rhonchi.  Abdominal:     General: Bowel sounds are normal. There is no distension.     Palpations: Abdomen is soft.     Tenderness: There is abdominal  tenderness.     Comments: Epigastric tenderness present  Lymphadenopathy:     Cervical: No cervical adenopathy.  Skin:    General: Skin is warm and dry.     Capillary Refill: Capillary refill takes less than 2 seconds.  Neurological:     Mental Status: She is alert and oriented to person, place, and time.  Psychiatric:        Mood and Affect: Mood normal.         Assessment & Plan:   Problem List Items Addressed This Visit     Gastroesophageal reflux disease with esophagitis without hemorrhage - Primary    Symptoms of ongoing cough at bedtime and in the mornings. Lungs are clear. I suspect this may be GERD related given that albuterol is not heling. Alarm symptoms are not present at this time. Patient is not experiencing signs of bleeding and is having new or worsening symptoms. Nonpharmacologic treatments were discussed including: eating smaller meals, elevation of the head of bed at night, avoidance of caffeine, chocolate, nicotine and peppermint, and avoiding tight fitting clothing. Will start a trial of pantoprazole.  Plan: - Monitor diet and try to determine if certain foods/drinks cause worsening symptoms - Avoid laying flat within a couple hours of eating.  - Start pantoprazole - If symptoms do not improve in 30 days, please follow-up  so we can determine it something else is going on. We can consider a lung function test.           Relevant Medications   pantoprazole (PROTONIX) 40 MG tablet   Situational anxiety    Intermittent anxiety related to family stressors. Nothing daily, but there are times of increased anxiety that can trigger panic. She is not currently taking medication for this.  Plan: - Trial of PRN xanax to help with intermittent periods of panic and anxiety.  - If symptoms are not resolved with medication, please let me know - If you find that symptoms are occurring more frequently or daily need for medication, please schedule a follow-up so we can  reassess your needs.       Relevant Medications   ALPRAZolam (XANAX) 0.25 MG tablet      Time: 30 minutes, >50% spent counseling, care coordination, chart review, and documentation.    Tollie Eth, DNP, AGNP-c   History, Medications, Surgery, SDOH, and Family History reviewed and updated as appropriate.

## 2022-08-28 NOTE — Patient Instructions (Addendum)
It sounds like you are dealing with silent reflux. I want to try a 30 day trial of pantoprazole and see if this helps. This is just once a day. If you have resolution of your symptoms with this we can always continue on this, but we can also trial off of it for a time being to see if it has resolved.   I have also sent in the xanax for you. Let me know if this is not helpful.

## 2022-09-19 ENCOUNTER — Ambulatory Visit
Admission: RE | Admit: 2022-09-19 | Discharge: 2022-09-19 | Disposition: A | Discharge status: Home / Self Care | Payer: Medicare HMO | Source: Ambulatory Visit | Attending: Nurse Practitioner | Admitting: Nurse Practitioner

## 2022-09-19 DIAGNOSIS — Z1231 Encounter for screening mammogram for malignant neoplasm of breast: Secondary | ICD-10-CM

## 2022-09-24 DIAGNOSIS — G4733 Obstructive sleep apnea (adult) (pediatric): Secondary | ICD-10-CM | POA: Diagnosis not present

## 2022-09-24 DIAGNOSIS — R400 Somnolence: Secondary | ICD-10-CM | POA: Diagnosis not present

## 2022-10-08 DIAGNOSIS — K2100 Gastro-esophageal reflux disease with esophagitis, without bleeding: Secondary | ICD-10-CM | POA: Insufficient documentation

## 2022-10-08 DIAGNOSIS — F418 Other specified anxiety disorders: Secondary | ICD-10-CM | POA: Insufficient documentation

## 2022-10-08 NOTE — Assessment & Plan Note (Signed)
Intermittent anxiety related to family stressors. Nothing daily, but there are times of increased anxiety that can trigger panic. She is not currently taking medication for this.  Plan: - Trial of PRN xanax to help with intermittent periods of panic and anxiety.  - If symptoms are not resolved with medication, please let me know - If you find that symptoms are occurring more frequently or daily need for medication, please schedule a follow-up so we can reassess your needs.

## 2022-10-08 NOTE — Assessment & Plan Note (Signed)
Symptoms of ongoing cough at bedtime and in the mornings. Lungs are clear. I suspect this may be GERD related given that albuterol is not heling. Alarm symptoms are not present at this time. Patient is not experiencing signs of bleeding and is having new or worsening symptoms. Nonpharmacologic treatments were discussed including: eating smaller meals, elevation of the head of bed at night, avoidance of caffeine, chocolate, nicotine and peppermint, and avoiding tight fitting clothing. Will start a trial of pantoprazole.  Plan: - Monitor diet and try to determine if certain foods/drinks cause worsening symptoms - Avoid laying flat within a couple hours of eating.  - Start pantoprazole - If symptoms do not improve in 30 days, please follow-up so we can determine it something else is going on. We can consider a lung function test.

## 2022-10-11 DIAGNOSIS — H31012 Macula scars of posterior pole (postinflammatory) (post-traumatic), left eye: Secondary | ICD-10-CM | POA: Diagnosis not present

## 2022-10-11 DIAGNOSIS — H25812 Combined forms of age-related cataract, left eye: Secondary | ICD-10-CM | POA: Diagnosis not present

## 2022-10-11 DIAGNOSIS — H21552 Recession of chamber angle, left eye: Secondary | ICD-10-CM | POA: Diagnosis not present

## 2022-10-11 DIAGNOSIS — H2511 Age-related nuclear cataract, right eye: Secondary | ICD-10-CM | POA: Diagnosis not present

## 2022-10-13 ENCOUNTER — Other Ambulatory Visit: Payer: Self-pay | Admitting: Nurse Practitioner

## 2022-10-13 DIAGNOSIS — F418 Other specified anxiety disorders: Secondary | ICD-10-CM

## 2022-10-15 NOTE — Telephone Encounter (Signed)
Last apt 08/28/22

## 2022-11-09 NOTE — Progress Notes (Deleted)
Office Visit Note  Patient: Kristin Gentry             Date of Birth: 25-Feb-1951           MRN: [id number]             PCP: Tollie Eth, NP Referring: Tollie Eth, NP Visit Date: 11/20/2022 Occupation: @GUAROCC @  Subjective:  No chief complaint on file.   History of Present Illness: Kristin Gentry is a 72 y.o. female ***     Activities of Daily Living:  Patient reports morning stiffness for *** {minute/hour:19697}.   Patient {ACTIONS;DENIES/REPORTS:21021675::"Denies"} nocturnal pain.  Difficulty dressing/grooming: {ACTIONS;DENIES/REPORTS:21021675::"Denies"} Difficulty climbing stairs: {ACTIONS;DENIES/REPORTS:21021675::"Denies"} Difficulty getting out of chair: {ACTIONS;DENIES/REPORTS:21021675::"Denies"} Difficulty using hands for taps, buttons, cutlery, and/or writing: {ACTIONS;DENIES/REPORTS:21021675::"Denies"}  No Rheumatology ROS completed.   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   Appendicitis, acute 07/16/2017   Acute appendicitis 07/16/2017    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   Cancer (HCC) 1989   cervical   GERD (gastroesophageal reflux disease)    Hypothyroidism    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   Thyroid disease    hypothyroid    Family History  Problem Relation Age of Onset   Hypertension Mother    Heart disease Mother    Thyroid disease Mother        hypothyroid   Diabetes Father     Hypertension Father    Heart disease Father    Thyroid disease Sister        hypothyroid   Past Surgical History:  Procedure Laterality Date   AUGMENTATION MAMMAPLASTY  07/2005   --saline   CESAREAN SECTION  1986   COLONOSCOPY WITH PROPOFOL N/A 11/12/2013   Procedure: COLONOSCOPY WITH PROPOFOL;  Surgeon: Charolett Bumpers, MD;  Location: [facility];  Service: Endoscopy;  Laterality: N/A;   GYNECOLOGIC CRYOSURGERY  1989   of cervix   LAPAROSCOPIC APPENDECTOMY N/A 07/16/2017   Procedure: APPENDECTOMY LAPAROSCOPIC;  Surgeon: Darnell Level, MD;  Location: [facility];  Service: General;  Laterality: N/A;   Total Vaginal Hysterectomy/BSO  2001   --fibroids   Social History   Social History Narrative   Not on file   Immunization History  Administered Date(s) Administered   Hepatitis A, Adult 07/13/2010   Hepatitis A, Ped/Adol-2 Dose 02/27/1996   Influenza Split 12/27/2009, 11/27/2010, 01/04/2014   Influenza, High Dose Seasonal PF 12/01/2017, 10/27/2018, 11/18/2020, 11/20/2021   Influenza,inj,Quad PF,6+ Mos 12/21/2016   Moderna Sars-Covid-2 Vaccination 04/21/2019, 05/19/2019   PFIZER(Purple Top)SARS-COV-2 Vaccination 01/08/2020   Pfizer Covid-19 Vaccine Bivalent Booster 11yrs & up 12/31/2020   Pneumococcal Conjugate-13 10/23/2017   Pneumococcal Polysaccharide-23 10/27/2018   Td 02/27/2008   Td (Adult) 03/12/2008   Tdap 10/13/2014   Typhoid Live 03/12/2008   Zoster Recombinant(Shingrix) 11/27/2018, 01/27/2019   Zoster, Live 02/13/2013, 11/27/2018, 01/27/2019     Objective: Vital Signs: There were no vitals taken for this visit.  Physical Exam   Musculoskeletal Exam: ***  CDAI Exam: CDAI Score: -- Patient Global: --; Provider Global: -- Swollen: --; Tender: -- Joint Exam 11/20/2022   No joint exam has been documented for this visit   There is currently no information documented on the homunculus. Go to the Rheumatology activity and complete the homunculus joint  exam.  Investigation: No additional findings.  Imaging: No results found.  Recent Labs: Lab Results  Component Value Date   WBC 9.2 07/25/2017   HGB 7.9 (L) 07/25/2017   PLT 481 (H) 07/25/2017   NA 136 07/22/2017   K 3.5 07/22/2017   CL 106 07/22/2017   CO2 23 07/22/2017   GLUCOSE 137 (H) 07/22/2017   BUN <5 (L) 07/22/2017   CREATININE 0.51 07/22/2017   BILITOT 0.4 07/22/2017   ALKPHOS 31 (L) 07/22/2017   AST 12 (L) 07/22/2017   ALT 14 07/22/2017   PROT 4.8 (L) 07/22/2017   ALBUMIN 2.2 (L) 07/22/2017   CALCIUM 7.4 (L) 07/22/2017   GFRAA >60 07/22/2017    Speciality Comments: No specialty comments available.  Procedures:  No procedures performed Allergies: Patient has no known allergies.   Assessment / Plan:     Visit Diagnoses: Positive ANA (antinuclear antibody) - 05/01/22: ANA+, dsDNA 4, RNP-, SM-, Scl-70 1.8, chromatin ab-, antiJO1-, centromere ab-, Ro-, La-, anti-CCP-, RF-  Unexplained chronic cough  Chronic dryness of both eyes  Age-related osteoporosis without current pathological fracture  Gastroesophageal reflux disease with esophagitis without hemorrhage  History of breast implant  Retroperitoneal hemorrhage  Situational anxiety  Orders: No orders of the defined types were placed in this encounter.  No orders of the defined types were placed in this encounter.   Face-to-face time spent with patient was *** minutes. Greater than 50% of time was spent in counseling and coordination of care.  Follow-Up Instructions: No follow-ups on file.   Gearldine Bienenstock, PA-C  Note - This record has been created using Dragon software.  Chart creation errors have been sought, but may not always  have been located. Such creation errors do not reflect on  the standard of medical care.

## 2022-11-15 ENCOUNTER — Encounter: Payer: Self-pay | Admitting: Nurse Practitioner

## 2022-11-15 DIAGNOSIS — H31012 Macula scars of posterior pole (postinflammatory) (post-traumatic), left eye: Secondary | ICD-10-CM | POA: Diagnosis not present

## 2022-11-15 DIAGNOSIS — H2511 Age-related nuclear cataract, right eye: Secondary | ICD-10-CM | POA: Diagnosis not present

## 2022-11-15 DIAGNOSIS — H40052 Ocular hypertension, left eye: Secondary | ICD-10-CM | POA: Diagnosis not present

## 2022-11-15 DIAGNOSIS — H25812 Combined forms of age-related cataract, left eye: Secondary | ICD-10-CM | POA: Diagnosis not present

## 2022-11-20 ENCOUNTER — Encounter: Payer: Medicare HMO | Admitting: Rheumatology

## 2022-11-20 DIAGNOSIS — Z9882 Breast implant status: Secondary | ICD-10-CM

## 2022-11-20 DIAGNOSIS — R053 Chronic cough: Secondary | ICD-10-CM

## 2022-11-20 DIAGNOSIS — R768 Other specified abnormal immunological findings in serum: Secondary | ICD-10-CM

## 2022-11-20 DIAGNOSIS — K2100 Gastro-esophageal reflux disease with esophagitis, without bleeding: Secondary | ICD-10-CM

## 2022-11-20 DIAGNOSIS — F418 Other specified anxiety disorders: Secondary | ICD-10-CM

## 2022-11-20 DIAGNOSIS — M810 Age-related osteoporosis without current pathological fracture: Secondary | ICD-10-CM

## 2022-11-20 DIAGNOSIS — H04123 Dry eye syndrome of bilateral lacrimal glands: Secondary | ICD-10-CM

## 2022-11-20 DIAGNOSIS — R58 Hemorrhage, not elsewhere classified: Secondary | ICD-10-CM

## 2022-11-26 ENCOUNTER — Telehealth: Payer: Self-pay | Admitting: Nurse Practitioner

## 2022-11-26 ENCOUNTER — Other Ambulatory Visit: Payer: Self-pay

## 2022-11-26 MED ORDER — LEVOTHYROXINE SODIUM 75 MCG PO TABS: 75.0000 ug | ORAL_TABLET | Freq: Every day | ORAL | 0 refills | Status: DC

## 2022-11-26 NOTE — Telephone Encounter (Signed)
Pt requesting a refill on levothyroxine sent to  [facility] - [place] - [address] AT PORTAL TO REGISTERED [address]

## 2022-11-27 DIAGNOSIS — H25812 Combined forms of age-related cataract, left eye: Secondary | ICD-10-CM | POA: Diagnosis not present

## 2022-12-12 NOTE — Progress Notes (Deleted)
Office Visit Note  Patient: Kristin Gentry             Date of Birth: 01/08/1951           MRN: [id number]             PCP: Tollie Eth, NP Referring: Tollie Eth, NP Visit Date: 12/26/2022 Occupation: @GUAROCC @  Subjective:  No chief complaint on file.   History of Present Illness: Kristin Gentry is a 72 y.o. female ***     Activities of Daily Living:  Patient reports morning stiffness for *** {minute/hour:19697}.   Patient {ACTIONS;DENIES/REPORTS:21021675::"Denies"} nocturnal pain.  Difficulty dressing/grooming: {ACTIONS;DENIES/REPORTS:21021675::"Denies"} Difficulty climbing stairs: {ACTIONS;DENIES/REPORTS:21021675::"Denies"} Difficulty getting out of chair: {ACTIONS;DENIES/REPORTS:21021675::"Denies"} Difficulty using hands for taps, buttons, cutlery, and/or writing: {ACTIONS;DENIES/REPORTS:21021675::"Denies"}  No Rheumatology ROS completed.   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   Appendicitis, acute 07/16/2017   Acute appendicitis 07/16/2017    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   Cancer (HCC) 1989   cervical   GERD (gastroesophageal reflux disease)    Hypothyroidism    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   Thyroid disease    hypothyroid    Family History  Problem Relation Age of Onset   Hypertension Mother    Heart disease Mother    Thyroid disease Mother        hypothyroid   Diabetes Father     Hypertension Father    Heart disease Father    Thyroid disease Sister        hypothyroid   Past Surgical History:  Procedure Laterality Date   AUGMENTATION MAMMAPLASTY  07/27/2005   --saline   CESAREAN SECTION  02/27/1984   COLONOSCOPY WITH PROPOFOL N/A 11/12/2013   Procedure: COLONOSCOPY WITH PROPOFOL;  Surgeon: Charolett Bumpers, MD;  Location: [facility];  Service: Endoscopy;  Laterality: N/A;   GYNECOLOGIC CRYOSURGERY  02/27/1987   of cervix   LAPAROSCOPIC APPENDECTOMY N/A 07/16/2017   Procedure: APPENDECTOMY LAPAROSCOPIC;  Surgeon: Darnell Level, MD;  Location: [facility];  Service: General;  Laterality: N/A;   Total Vaginal Hysterectomy/BSO  02/27/1999   --fibroids   tubular adenoma N/A 11/12/2013   multifragments   Social History   Social History Narrative   Not on file   Immunization History  Administered Date(s) Administered   Hepatitis A, Adult 07/13/2010   Hepatitis A, Ped/Adol-2 Dose 02/27/1996   Influenza Split 12/27/2009, 11/27/2010, 01/04/2014   Influenza, High Dose Seasonal PF 12/01/2017, 10/27/2018, 11/18/2020, 11/20/2021   Influenza,inj,Quad PF,6+ Mos 12/21/2016   Moderna Sars-Covid-2 Vaccination 04/21/2019, 05/19/2019   PFIZER(Purple Top)SARS-COV-2 Vaccination 01/08/2020   Pfizer Covid-19 Vaccine Bivalent Booster 35yrs & up 12/31/2020   Pneumococcal Conjugate-13 10/23/2017   Pneumococcal Polysaccharide-23 10/27/2018   Td 02/27/2008   Td (Adult) 03/12/2008   Tdap 10/13/2014   Typhoid Live 03/12/2008   Zoster Recombinant(Shingrix) 11/27/2018, 01/27/2019   Zoster, Live 02/13/2013, 11/27/2018, 01/27/2019     Objective: Vital Signs:  There were no vitals taken for this visit.   Physical Exam   Musculoskeletal Exam: ***  CDAI Exam: CDAI Score: -- Patient Global: --; Provider Global: -- Swollen: --; Tender: -- Joint Exam 12/26/2022   No joint exam has been documented for this visit   There is currently no information documented on the homunculus.  Go to the Rheumatology activity and complete the homunculus joint exam.  Investigation: No additional findings.  Imaging: No results found.  Recent Labs: Lab Results  Component Value Date   WBC 9.2 07/25/2017   HGB 7.9 (L) 07/25/2017   PLT 481 (H) 07/25/2017   NA 136 07/22/2017   K 3.5 07/22/2017   CL 106 07/22/2017   CO2 23 07/22/2017   GLUCOSE 137 (H) 07/22/2017   BUN <5 (L) 07/22/2017   CREATININE 0.51 07/22/2017   BILITOT 0.4 07/22/2017   ALKPHOS 31 (L) 07/22/2017   AST 12 (L) 07/22/2017   ALT 14 07/22/2017   PROT 4.8 (L) 07/22/2017   ALBUMIN 2.2 (L) 07/22/2017   CALCIUM 7.4 (L) 07/22/2017   GFRAA >60 07/22/2017    Speciality Comments: No specialty comments available.  Procedures:  No procedures performed Allergies: Patient has no known allergies.   Assessment / Plan:     Visit Diagnoses: Positive ANA (antinuclear antibody) - 05/01/22: ANA+, dsDNA 4, SM-, RNP-, Scl-70 1.8+, chromatin ab-, centromere ab-, Anti-Jo1-  Scl-70 antibody positive - Scl-70+ 1.8 on 05/01/22.  Unexplained chronic cough  Gastroesophageal reflux disease with esophagitis without hemorrhage  Chronic dryness of both eyes  Polyarthralgia  Age-related osteoporosis without current pathological fracture  History of breast implant  Retroperitoneal hemorrhage  Situational anxiety  Orders: No orders of the defined types were placed in this encounter.  No orders of the defined types were placed in this encounter.   Face-to-face time spent with patient was *** minutes. Greater than 50% of time was spent in counseling and coordination of care.  Follow-Up Instructions: No follow-ups on file.   Gearldine Bienenstock, PA-C  Note - This record has been created using Dragon software.  Chart creation errors have been sought, but may not always  have been located. Such creation errors do not reflect on  the standard of medical care.

## 2022-12-14 ENCOUNTER — Ambulatory Visit: Payer: Medicare HMO | Admitting: Rheumatology

## 2022-12-18 DIAGNOSIS — H31012 Macula scars of posterior pole (postinflammatory) (post-traumatic), left eye: Secondary | ICD-10-CM | POA: Diagnosis not present

## 2022-12-18 DIAGNOSIS — H25812 Combined forms of age-related cataract, left eye: Secondary | ICD-10-CM | POA: Diagnosis not present

## 2022-12-18 DIAGNOSIS — H524 Presbyopia: Secondary | ICD-10-CM | POA: Diagnosis not present

## 2022-12-18 DIAGNOSIS — H21552 Recession of chamber angle, left eye: Secondary | ICD-10-CM | POA: Diagnosis not present

## 2022-12-18 DIAGNOSIS — H31002 Unspecified chorioretinal scars, left eye: Secondary | ICD-10-CM | POA: Diagnosis not present

## 2022-12-18 DIAGNOSIS — H268 Other specified cataract: Secondary | ICD-10-CM | POA: Diagnosis not present

## 2022-12-18 DIAGNOSIS — H40052 Ocular hypertension, left eye: Secondary | ICD-10-CM | POA: Diagnosis not present

## 2022-12-25 ENCOUNTER — Telehealth: Payer: Self-pay | Admitting: Internal Medicine

## 2022-12-25 DIAGNOSIS — M810 Age-related osteoporosis without current pathological fracture: Secondary | ICD-10-CM

## 2022-12-25 MED ORDER — DENOSUMAB 60 MG/ML ~~LOC~~ SOSY
60.0000 mg | PREFILLED_SYRINGE | Freq: Once | SUBCUTANEOUS | Status: AC
Start: 2023-01-05 — End: ?

## 2022-12-25 NOTE — Telephone Encounter (Signed)
See prolia referral

## 2022-12-26 ENCOUNTER — Telehealth: Payer: Self-pay | Admitting: Nurse Practitioner

## 2022-12-26 ENCOUNTER — Encounter: Payer: Medicare HMO | Admitting: Rheumatology

## 2022-12-26 DIAGNOSIS — R768 Other specified abnormal immunological findings in serum: Secondary | ICD-10-CM

## 2022-12-26 DIAGNOSIS — Z9882 Breast implant status: Secondary | ICD-10-CM

## 2022-12-26 DIAGNOSIS — M2550 Pain in unspecified joint: Secondary | ICD-10-CM

## 2022-12-26 DIAGNOSIS — R053 Chronic cough: Secondary | ICD-10-CM

## 2022-12-26 DIAGNOSIS — M810 Age-related osteoporosis without current pathological fracture: Secondary | ICD-10-CM

## 2022-12-26 DIAGNOSIS — F418 Other specified anxiety disorders: Secondary | ICD-10-CM

## 2022-12-26 DIAGNOSIS — K2100 Gastro-esophageal reflux disease with esophagitis, without bleeding: Secondary | ICD-10-CM

## 2022-12-26 DIAGNOSIS — H04123 Dry eye syndrome of bilateral lacrimal glands: Secondary | ICD-10-CM

## 2022-12-26 DIAGNOSIS — R58 Hemorrhage, not elsewhere classified: Secondary | ICD-10-CM

## 2022-12-26 NOTE — Telephone Encounter (Signed)
Will note this in chart

## 2022-12-26 NOTE — Telephone Encounter (Signed)
Jeanine called to call Kristin Gentry back but she also says she wants to be taken off of prolia because she went to the dentist and they advised it was making the bones in her teeth deteriorate and she asks Madelaine Bhat to give her a call tomorrow please.

## 2023-01-23 DIAGNOSIS — R400 Somnolence: Secondary | ICD-10-CM | POA: Diagnosis not present

## 2023-01-23 DIAGNOSIS — G4733 Obstructive sleep apnea (adult) (pediatric): Secondary | ICD-10-CM | POA: Diagnosis not present

## 2023-02-06 ENCOUNTER — Other Ambulatory Visit: Payer: Self-pay | Admitting: Nurse Practitioner

## 2023-02-13 ENCOUNTER — Encounter: Payer: Self-pay | Admitting: Nurse Practitioner

## 2023-02-13 ENCOUNTER — Ambulatory Visit: Payer: Medicare HMO | Admitting: Nurse Practitioner

## 2023-02-13 VITALS — BP 128/82 | HR 68 | Wt 124.6 lb

## 2023-02-13 DIAGNOSIS — T458X5A Adverse effect of other primarily systemic and hematological agents, initial encounter: Secondary | ICD-10-CM

## 2023-02-13 DIAGNOSIS — M25562 Pain in left knee: Secondary | ICD-10-CM

## 2023-02-13 DIAGNOSIS — M810 Age-related osteoporosis without current pathological fracture: Secondary | ICD-10-CM

## 2023-02-13 DIAGNOSIS — M25462 Effusion, left knee: Secondary | ICD-10-CM

## 2023-02-13 DIAGNOSIS — M87180 Osteonecrosis due to drugs, jaw: Secondary | ICD-10-CM | POA: Diagnosis not present

## 2023-02-13 MED ORDER — VITAMIN D (ERGOCALCIFEROL) 1.25 MG (50000 UNIT) PO CAPS: 50000.0000 [IU] | ORAL_CAPSULE | ORAL | 3 refills | Status: AC

## 2023-02-13 NOTE — Assessment & Plan Note (Signed)
Adverse reactions to oral bisphosphonates and Prolia. Prefers calcium and vitamin D supplementation and weight-bearing exercises. Discussed risks of bisphosphonates and benefits of non-pharmacological management. - Continue calcium 1200 mg daily - Continue vitamin D supplementation, consider 50,000 IU weekly - Encourage weight-bearing exercises

## 2023-02-13 NOTE — Assessment & Plan Note (Signed)
Chronic left knee pain with daily symptoms and locking, likely due to a meniscal tear. Swelling and size discrepancy noted. Pain exacerbated by weight-bearing activities. Discussed gel injections, steroid injections, and potential surgical interventions. Prefers less invasive options. - Refer to Dr. Benjiman Core for evaluation and possible ultrasound-guided injections - Consider gel or steroid injections for pain management
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
## 2023-02-13 NOTE — Patient Instructions (Signed)

## 2023-02-13 NOTE — Assessment & Plan Note (Signed)
Likely secondary to Prolia use, with jawbone loss requiring a cadaver bone graft. The graft has taken, and dental implant options are being considered. Discontinued Prolia due to adverse reactions, opting for non-pharmacological management. - Discontinue Prolia - Increase calcium intake to 1200 mg daily - Increase vitamin D intake, consider 50,000 IU weekly - Monitor bone density in a few years

## 2023-03-04 ENCOUNTER — Encounter: Payer: Self-pay | Admitting: Nurse Practitioner

## 2023-03-12 ENCOUNTER — Encounter: Payer: Self-pay | Admitting: Family Medicine

## 2023-03-12 ENCOUNTER — Ambulatory Visit (INDEPENDENT_AMBULATORY_CARE_PROVIDER_SITE_OTHER): Payer: Medicare HMO | Admitting: Family Medicine

## 2023-03-12 ENCOUNTER — Other Ambulatory Visit: Payer: Self-pay

## 2023-03-12 VITALS — BP 118/82 | Ht 64.0 in | Wt 122.0 lb

## 2023-03-12 DIAGNOSIS — G8929 Other chronic pain: Secondary | ICD-10-CM

## 2023-03-12 DIAGNOSIS — M1712 Unilateral primary osteoarthritis, left knee: Secondary | ICD-10-CM
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
## 2023-03-13 NOTE — Addendum Note (Signed)
 Addended by: Rodgers Clack on: 03/13/2023 09:54 AM   Modules accepted: Level of Service

## 2023-04-29 ENCOUNTER — Encounter: Payer: Self-pay | Admitting: Nurse Practitioner

## 2023-05-01 ENCOUNTER — Ambulatory Visit (INDEPENDENT_AMBULATORY_CARE_PROVIDER_SITE_OTHER): Admitting: Medical

## 2023-05-01 ENCOUNTER — Encounter: Payer: Self-pay | Admitting: Medical

## 2023-05-01 VITALS — BP 122/80 | HR 89 | Temp 98.0°F | Wt 125.8 lb

## 2023-05-01 DIAGNOSIS — R509 Fever, unspecified: Secondary | ICD-10-CM

## 2023-05-01 DIAGNOSIS — R059 Cough, unspecified: Secondary | ICD-10-CM

## 2023-05-01 LAB — POC COVID19 BINAXNOW: SARS Coronavirus 2 Ag: NEGATIVE

## 2023-05-01 LAB — POCT INFLUENZA A/B
Influenza A, POC: NEGATIVE
Influenza B, POC: NEGATIVE

## 2023-05-01 MED ORDER — BENZONATATE 200 MG PO CAPS: 200.0000 mg | ORAL_CAPSULE | Freq: Three times a day (TID) | ORAL | 0 refills | Status: AC | PRN

## 2023-05-01 MED ORDER — ALBUTEROL SULFATE HFA 108 (90 BASE) MCG/ACT IN AERS: 2.0000 | INHALATION_SPRAY | Freq: Four times a day (QID) | RESPIRATORY_TRACT | 0 refills | Status: DC | PRN

## 2023-05-01 MED ORDER — PREDNISONE 20 MG PO TABS: ORAL_TABLET | ORAL | 0 refills | Status: AC

## 2023-05-01 MED ORDER — HYDROCOD POLI-CHLORPHE POLI ER 10-8 MG/5ML PO SUER
5.0000 mL | Freq: Two times a day (BID) | ORAL | 0 refills | Status: AC
Start: 2023-05-01 — End: ?

## 2023-05-01 NOTE — Progress Notes (Signed)
 Subjective:  Kristin Gentry is a 73 y.o. female who presents for Chief Complaint  Patient presents with   other    Saturday starting feeling like crap but cough has been going on for a wile since she went to [facility]. Coughing all night long, nothing has stopped it, tired inhalers, cough drops cough syrups, runny nose, before in [place] it was allergies she felt like chest is hurting from coughing,      Here for possible illness.  Had bene having a cough for a few weeks, but symptoms changed 5 days ago for the worse.  The new symptoms in the past 5 days includes ongoing cough, fatigue, abrupt feeling of malaise on Saturday.  The next day exhausted, still lots of cough, chills, fatigue.    Has tried inhaler, delsym, robitussin, albuterol inhaler and a second inhaler.   The medicaiton still doesn't really help the cough go away.  Coughing so much that chest is sore from constant coughing.  Has had some sore throat.   Maybe slight fever this past weekend.  Has had some nausea, some drainage and mucous.   Has headache, using tylenol every 6 hours.    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  Lost voice few days ago some.    Has been around 25 yo grandson that was coughing.  Has had several sick contacts recently.    Nonsmoker, no hx/o asthma.  No other aggravating or relieving factors.    No other c/o.  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     calcium carbonate (OS-CAL) 600 MG TABS tablet Take 600 mg by mouth 2 (two) times daily with a meal.     cholecalciferol (VITAMIN D) 1000 UNITS tablet Take 1,000 Units by mouth every morning.      levothyroxine (SYNTHROID) 75 MCG tablet TAKE 1 TABLET DAILY BEFORE BREAKFAST 90 tablet 1   Vitamin D, Ergocalciferol, (DRISDOL) 1.25 MG (50000 UNIT) CAPS capsule Take 1 capsule (50,000 Units total) by mouth every 7 (seven) days. (Patient not taking: Reported on 05/01/2023) 12 capsule 3   Current Facility-Administered Medications on File Prior to Visit  Medication Dose Route Frequency Provider Last Rate Last Admin   denosumab (PROLIA) injection 60 mg  60 mg Subcutaneous Once Early, Sung Amabile, NP         The following portions of the patient's history were reviewed and updated as appropriate: allergies, current medications, past family history, past medical history, past social history, past surgical history and problem list.  ROS Otherwise as in subjective above    Objective: BP 122/80   Pulse 89   Temp 98 F (36.7 C)   Wt 125 lb 12.8 oz (57.1 kg)   SpO2 96%  BMI 21.59 kg/m   General appearance: alert, no distress, well developed, well nourished HEENT: normocephalic, sclerae anicteric, conjunctiva pink and moist, TMs pearly, nares with some turbinated edema, clear  discharge,+ erythema, pharynx with post nasal drainage Oral cavity: MMM, no lesions Neck: supple, no lymphadenopathy, no thyromegaly, no masses Heart: RRR, normal S1, S2, no murmurs Lungs: CTA bilaterally, no wheezes, rhonchi, or rales Pulses: 2+ radial pulses, 2+ pedal pulses, normal cap refill Ext: no edema   Assessment: Encounter Diagnoses  Name Primary?   Cough, unspecified type Yes   Fever, unspecified fever cause      Plan: We discussed symptoms and concerns.  COVID and flu negative.  Symptoms suggest  more of a bronchitis or viral picture.  Advised continued good hydration, relative rest, she can use either Tussionex at night for worse cough with Tessalon Perles for cough during the day, I renewed a up-to-date inhaler she can use 2-4 times a day for wheezing or shortness of breath or coughing fits.  However we did discuss that her lungs are clear today.  Begin 3 days short burst of prednisone.  If not much improved in the next 48 hours then call or recheck  [name] was seen today for other.  Diagnoses and all orders for this visit:  Cough, unspecified type -     POC COVID-19  Fever, unspecified fever cause -     Influenza A/B  Other orders -     chlorpheniramine-HYDROcodone (TUSSIONEX) 10-8 MG/5ML; Take 5 mLs by mouth 2 (two) times daily. -     benzonatate (TESSALON) 200 MG capsule; Take 1 capsule (200 mg total) by mouth 3 (three) times daily as needed for cough. -     albuterol (VENTOLIN HFA) 108 (90 Base) MCG/ACT inhaler; Inhale 2 puffs into the lungs every 6 (six) hours as needed for wheezing or shortness of breath. -     predniSONE (DELTASONE) 20 MG tablet; 3 tablets today, 2 tablets tomorrow, 1 tablet the third day    Follow up: prn

## 2023-05-09 ENCOUNTER — Ambulatory Visit: Payer: Self-pay | Admitting: Nurse Practitioner

## 2023-05-09 DIAGNOSIS — R509 Fever, unspecified: Secondary | ICD-10-CM

## 2023-05-09 DIAGNOSIS — R059 Cough, unspecified: Secondary | ICD-10-CM

## 2023-05-09 NOTE — Telephone Encounter (Signed)
 Chief Complaint: cough Symptoms: occasional productive, persistent Frequency: 2 weeks Pertinent Negatives: Patient denies fever, other URI sx Disposition: [] ED /[] Urgent Care (no appt availability in office) / [x] Appointment(In office/virtual)/ []  [facility]/ [] Home Care/ [] Refused Recommended Disposition /[]  Mobile Bus/ [x]  Follow-up with PCP Additional Notes: Pt c/o of persistent cough x 2 weeks. Recently had acute visit with PCP and was prescribed 3 day steroids, albuterol and tessalon pearls. The only medication not taken was cough syrup d/t lack of insurance coverage. Pt endorses that she initially felt better when taking steroids, but coughing has come back. Attempted to schedule appt per protocol, but no access available at primary PC. Pt inquired triager to ask PCP if additional steroids/tessalon could be prescribed. Triager will forward encounter for Huntley Dec, NP to review and advise. Patient verbalized understanding and is expecting call back from office if Rx is able to be sent. Triager also advised that if pt does not hear back from office, to follow disposition for further evaluation/treatment.   Copied from CRM [id number]. Topic: Clinical - Medical Advice >> May 09, 2023  4:31 PM Tiffany S wrote: Reason for CRM: Patient called stating she still has cough after taking medications.  She stated her insurance didn't cover chlorpheniramine-HYDROcodone (TUSSIONEX) 10-8 MG/5ML [[id number]] so she never got it. Patient requesting call back. Reason for Disposition  [1] Continuous (nonstop) coughing interferes with work or school AND [2] no improvement using cough treatment per Care Advice  Answer Assessment - Initial Assessment Questions 1. ONSET: "When did the cough begin?"      2 weeks Recent acute PCP visit - completed 3 days of steroids Has been using inhaler and tessalon 2. SEVERITY: "How bad is the cough today?"      Consistent throughout day Notices coughs more  s/p inhaler 3. SPUTUM: "Describe the color of your sputum" (none, dry cough; clear, white, yellow, green)     Productive, but not yellow 4. HEMOPTYSIS: "Are you coughing up any blood?" If so ask: "How much?" (flecks, streaks, tablespoons, etc.)     no 5. DIFFICULTY BREATHING: "Are you having difficulty breathing?" If Yes, ask: "How bad is it?" (e.g., mild, moderate, severe)    - MILD: No SOB at rest, mild SOB with walking, speaks normally in sentences, can lie down, no retractions, pulse < 100.    - MODERATE: SOB at rest, SOB with minimal exertion and prefers to sit, cannot lie down flat, speaks in phrases, mild retractions, audible wheezing, pulse 100-120.    - SEVERE: Very SOB at rest, speaks in single words, struggling to breathe, sitting hunched forward, retractions, pulse > 120      no 6. FEVER: "Do you have a fever?" If Yes, ask: "What is your temperature, how was it measured, and when did it start?"     No, some nausea from drainage 7. CARDIAC HISTORY: "Do you have any history of heart disease?" (e.g., heart attack, congestive heart failure)      N/a 8. LUNG HISTORY: "Do you have any history of lung disease?"  (e.g., pulmonary embolus, asthma, emphysema)     N/a 9. PE RISK FACTORS: "Do you have a history of blood clots?" (or: recent major surgery, recent prolonged travel, bedridden)     N/a 10. OTHER SYMPTOMS: "Do you have any other symptoms?" (e.g., runny nose, wheezing, chest pain)       none  12. TRAVEL: "Have you traveled out of the country in the last month?" (e.g., travel history, exposures)  Recent travel to [facility]  Protocols used: Cough - Acute Non-Productive-A-AH

## 2023-05-10 MED ORDER — AZITHROMYCIN 250 MG PO TABS
ORAL_TABLET | ORAL | 0 refills | Status: AC
Start: 2023-05-10 — End: 2023-05-15

## 2023-05-10 NOTE — Telephone Encounter (Signed)
 Patient saw Vincenza Hews for appt. Will send azithromycin for suspected bacterial infection.   Please let her know I have sent this.

## 2023-05-10 NOTE — Addendum Note (Signed)
 Addended by: Aaleyah Witherow, Huntley Dec E on: 05/10/2023 08:25 AM   Modules accepted: Orders

## 2023-05-21 ENCOUNTER — Other Ambulatory Visit: Payer: Self-pay | Admitting: Medical

## 2023-06-20 ENCOUNTER — Other Ambulatory Visit: Payer: Self-pay | Admitting: Medical

## 2023-06-27 ENCOUNTER — Telehealth: Payer: Self-pay

## 2023-06-27 NOTE — Telephone Encounter (Signed)
 Called pt. Back had to LM.   Copied from CRM [id number]. Topic: Clinical - Medical Advice >> Jun 27, 2023  1:19 PM Everlene Hobby D wrote: Patient wants a call back from a nurse at the office

## 2023-07-19 ENCOUNTER — Telehealth: Payer: Self-pay | Admitting: Internal Medicine

## 2023-07-19 MED ORDER — LEVOTHYROXINE SODIUM 75 MCG PO TABS: 75.0000 ug | ORAL_TABLET | Freq: Every day | ORAL | 0 refills | Status: DC

## 2023-07-19 NOTE — Telephone Encounter (Signed)
 Patient has an cpe scheduled for July. Went ahead and refilled medication to mail order.   Copied from CRM [id number]. Topic: Clinical - Prescription Issue >> Jul 19, 2023  8:09 AM Everette C wrote: Reason for CRM: The patient's pharmacy has contacted them and instructed them to contact their PCP and request follow up on their prescription for levothyroxine  (SYNTHROID ) 75 MCG tablet [[id number]]   The patient would like for their PCP to please contact their pharmacy when possible [facility] - [place], [place] - [address] [facility] AT Portal to [facility] [address] [place] [address] Phone: [phone] Fax: [phone]  Please contact the patient further if needed

## 2023-07-24 DIAGNOSIS — D125 Benign neoplasm of sigmoid colon: Secondary | ICD-10-CM | POA: Diagnosis not present

## 2023-07-24 DIAGNOSIS — Z09 Encounter for follow-up examination after completed treatment for conditions other than malignant neoplasm: Secondary | ICD-10-CM | POA: Diagnosis not present

## 2023-07-24 DIAGNOSIS — D123 Benign neoplasm of transverse colon: Secondary | ICD-10-CM | POA: Diagnosis not present

## 2023-07-24 DIAGNOSIS — Z800 Family history of malignant neoplasm of digestive organs: Secondary | ICD-10-CM | POA: Diagnosis not present

## 2023-07-24 DIAGNOSIS — Z860101 Personal history of adenomatous and serrated colon polyps: Secondary | ICD-10-CM | POA: Diagnosis not present

## 2023-07-24 DIAGNOSIS — D122 Benign neoplasm of ascending colon: Secondary | ICD-10-CM | POA: Diagnosis not present

## 2023-07-24 LAB — HM COLONOSCOPY

## 2023-07-26 DIAGNOSIS — D125 Benign neoplasm of sigmoid colon: Secondary | ICD-10-CM | POA: Diagnosis not present

## 2023-07-26 DIAGNOSIS — D123 Benign neoplasm of transverse colon: Secondary | ICD-10-CM | POA: Diagnosis not present

## 2023-07-26 DIAGNOSIS — D122 Benign neoplasm of ascending colon: Secondary | ICD-10-CM | POA: Diagnosis not present

## 2023-07-29 ENCOUNTER — Encounter: Payer: Self-pay | Admitting: Nurse Practitioner

## 2023-08-01 DIAGNOSIS — H2511 Age-related nuclear cataract, right eye: Secondary | ICD-10-CM | POA: Diagnosis not present

## 2023-08-01 DIAGNOSIS — H31012 Macula scars of posterior pole (postinflammatory) (post-traumatic), left eye: Secondary | ICD-10-CM | POA: Diagnosis not present

## 2023-08-01 DIAGNOSIS — H21552 Recession of chamber angle, left eye: Secondary | ICD-10-CM | POA: Diagnosis not present

## 2023-08-01 DIAGNOSIS — H40052 Ocular hypertension, left eye: Secondary | ICD-10-CM | POA: Diagnosis not present

## 2023-08-06 ENCOUNTER — Encounter: Payer: Self-pay | Admitting: Nurse Practitioner

## 2023-08-27 ENCOUNTER — Other Ambulatory Visit: Payer: Self-pay | Admitting: Nurse Practitioner

## 2023-08-27 DIAGNOSIS — Z1231 Encounter for screening mammogram for malignant neoplasm of breast: Secondary | ICD-10-CM

## 2023-09-03 ENCOUNTER — Encounter: Admitting: Nurse Practitioner

## 2023-09-15 ENCOUNTER — Other Ambulatory Visit: Payer: Self-pay | Admitting: Nurse Practitioner

## 2023-09-29 ENCOUNTER — Other Ambulatory Visit: Payer: Self-pay | Admitting: Nurse Practitioner

## 2023-10-07 ENCOUNTER — Ambulatory Visit
Admission: RE | Admit: 2023-10-07 | Discharge: 2023-10-07 | Disposition: A | Discharge status: Home / Self Care | Source: Ambulatory Visit | Attending: Nurse Practitioner

## 2023-10-07 DIAGNOSIS — Z1231 Encounter for screening mammogram for malignant neoplasm of breast: Secondary | ICD-10-CM | POA: Diagnosis not present

## 2023-10-09 ENCOUNTER — Ambulatory Visit: Payer: Self-pay | Admitting: Nurse Practitioner

## 2023-10-18 DIAGNOSIS — D1801 Hemangioma of skin and subcutaneous tissue: Secondary | ICD-10-CM | POA: Diagnosis not present

## 2023-11-07 ENCOUNTER — Encounter: Payer: Self-pay | Admitting: Nurse Practitioner

## 2023-11-07 ENCOUNTER — Other Ambulatory Visit: Payer: Self-pay

## 2023-11-07 MED ORDER — LEVOTHYROXINE SODIUM 75 MCG PO TABS: 75.0000 ug | ORAL_TABLET | Freq: Every day | ORAL | 1 refills | Status: DC

## 2023-11-11 ENCOUNTER — Other Ambulatory Visit: Payer: Self-pay

## 2023-11-11 MED ORDER — LEVOTHYROXINE SODIUM 75 MCG PO TABS: 75.0000 ug | ORAL_TABLET | Freq: Every day | ORAL | 0 refills | Status: AC

## 2023-11-13 ENCOUNTER — Encounter: Payer: Self-pay | Admitting: Family Medicine

## 2023-11-13 ENCOUNTER — Ambulatory Visit (INDEPENDENT_AMBULATORY_CARE_PROVIDER_SITE_OTHER): Admitting: Family Medicine

## 2023-11-13 VITALS — BP 126/78 | HR 56 | Wt 126.0 lb

## 2023-11-13 DIAGNOSIS — E039 Hypothyroidism, unspecified: Secondary | ICD-10-CM

## 2023-11-13 DIAGNOSIS — R5383 Other fatigue: Secondary | ICD-10-CM

## 2023-11-13 NOTE — Progress Notes (Signed)
 Name: Kristin Gentry   Date of Visit: 11/13/23   Date of last visit with me: Visit date not found   CHIEF COMPLAINT:  Chief Complaint  Patient presents with   Acute Visit    Fatigue, weight gain, sleeping a lot.        HPI:  Discussed the use of AI scribe software for clinical note transcription with the patient, who gave verbal consent to proceed.  History of Present Illness   Kristin Gentry is a 73 year old female with thyroid  issues who presents with fatigue.  She experiences significant fatigue, describing it as feeling like 'narcolepsy' and being exhausted. Her fatigue has worsened over the past couple of weeks, although it was initially gradual. She attributes some of the fatigue to stress related to her children and their adult lives.  She has not had blood work done for her thyroid  in two years, although she used to have it checked annually when she was under the care of Dr. Signa, who has since retired. She is currently taking levothyroxine  (Synthroid ) at a dose of 75 micrograms. Her daughter suggested that her thyroid  might be off, as she has experienced similar symptoms when her thyroid  levels were abnormal in the past. She recalls that her thyroid  levels would be stable for a while and then drop, requiring a temporary increase in her medication dosage to stabilize them again.  Regarding her sleep, she mentions using a CPAP machine in the past but discontinued it because it disrupted her sleep, causing her to be up all night due to the tube getting wrapped around her. She generally sleeps better without the CPAP, although she does wake up occasionally during the night, which she attributes to age.  She recalls having issues with iron levels in the past, particularly after her appendix burst and she lost a significant amount of blood. Her previous doctor monitored her iron levels closely.         OBJECTIVE:       02/13/2023   11:26 AM  Depression screen  PHQ 2/9  Decreased Interest 0  Down, Depressed, Hopeless 0  PHQ - 2 Score 0     BP Readings from Last 3 Encounters:  11/13/23 126/78  05/01/23 122/80  03/12/23 118/82    BP 126/78   Pulse (!) 56   Wt 126 lb (57.2 kg)   SpO2 96%   BMI 21.63 kg/m    Physical Exam          Physical Exam Constitutional:      Appearance: Normal appearance.  Neurological:     General: No focal deficit present.     Mental Status: She is alert and oriented to person, place, and time. Mental status is at baseline.     ASSESSMENT/PLAN:   Assessment & Plan Hypothyroidism, unspecified type  Other fatigue    Assessment and Plan    Fatigue Worsening fatigue possibly due to stress, hypothyroidism, iron deficiency anemia, or vitamin D  deficiency. - Order iron panel. - Order vitamin D  level. - Will evaluate iron levels, tsh and vitamin d  and determine if this is a metabolic cause vs possible apnea issue.   Hypothyroidism Increased fatigue may be related to thyroid  function. Levothyroxine  dosage may need adjustment. - Order thyroid  function tests. Has been 2 years since last lab which was reivewed by me from 09/07/2021 - Adjust levothyroxine  dosage based on lab results.  Obstructive sleep apnea Discontinued CPAP due to discomfort.  - Encourage retrying  CPAP machine to assess impact on sleep quality.         Kristin Gentry A. Vita MD [facility]

## 2023-11-14 ENCOUNTER — Encounter: Payer: Self-pay | Admitting: Nurse Practitioner

## 2023-11-14 LAB — CBC WITH DIFFERENTIAL/PLATELET
Basophils Absolute: 0 x10E3/uL (ref 0.0–0.2)
Basos: 1 %
EOS (ABSOLUTE): 0.1 x10E3/uL (ref 0.0–0.4)
Eos: 1 %
Hematocrit: 40.5 % (ref 34.0–46.6)
Hemoglobin: 13.6 g/dL (ref 11.1–15.9)
Immature Grans (Abs): 0 x10E3/uL (ref 0.0–0.1)
Immature Granulocytes: 0 %
Lymphocytes Absolute: 1.3 x10E3/uL (ref 0.7–3.1)
Lymphs: 26 %
MCH: 33.3 pg — ABNORMAL HIGH (ref 26.6–33.0)
MCHC: 33.6 g/dL (ref 31.5–35.7)
MCV: 99 fL — ABNORMAL HIGH (ref 79–97)
Monocytes Absolute: 0.5 x10E3/uL (ref 0.1–0.9)
Monocytes: 10 %
Neutrophils Absolute: 3.1 x10E3/uL (ref 1.4–7.0)
Neutrophils: 61 %
Platelets: 324 x10E3/uL (ref 150–450)
RBC: 4.09 x10E6/uL (ref 3.77–5.28)
RDW: 12.6 % (ref 11.7–15.4)
WBC: 5 x10E3/uL (ref 3.4–10.8)

## 2023-11-14 LAB — IRON,TIBC AND FERRITIN PANEL
Ferritin: 57 ng/mL (ref 15–150)
Iron Saturation: 44 % (ref 15–55)
Iron: 127 ug/dL (ref 27–139)
Total Iron Binding Capacity: 291 ug/dL (ref 250–450)
UIBC: 164 ug/dL (ref 118–369)

## 2023-11-14 LAB — VITAMIN D 25 HYDROXY (VIT D DEFICIENCY, FRACTURES): Vit D, 25-Hydroxy: 57.1 ng/mL (ref 30.0–100.0)

## 2023-11-14 LAB — THYROID CASCADE PROFILE: TSH: 0.683 u[IU]/mL (ref 0.450–4.500)

## 2023-11-25 ENCOUNTER — Ambulatory Visit: Payer: Self-pay | Admitting: Family Medicine

## 2023-12-19 ENCOUNTER — Telehealth: Payer: Self-pay | Admitting: Nurse Practitioner

## 2023-12-19 ENCOUNTER — Ambulatory Visit

## 2023-12-19 NOTE — Telephone Encounter (Signed)
 Copied from CRM #[id number]. Topic: Appointments - Appointment Cancel/Reschedule >> Dec 19, 2023  8:13 AM Amy B wrote: FYI Patient is calling to cancel an appointment.  Upon attempting to cancel, received message Patients with an arrived status cannot be cancelled.  Patient has a dental appointment and cannot make her annual wellness video visit.  Please cancel.  She states she will call back later to reschedule.

## 2023-12-24 DIAGNOSIS — N96 Recurrent pregnancy loss: Secondary | ICD-10-CM | POA: Diagnosis not present

## 2023-12-24 DIAGNOSIS — M1990 Unspecified osteoarthritis, unspecified site: Secondary | ICD-10-CM | POA: Diagnosis not present

## 2023-12-24 DIAGNOSIS — E039 Hypothyroidism, unspecified: Secondary | ICD-10-CM | POA: Diagnosis not present

## 2023-12-24 DIAGNOSIS — Z8349 Family history of other endocrine, nutritional and metabolic diseases: Secondary | ICD-10-CM | POA: Diagnosis not present

## 2024-01-01 DIAGNOSIS — E039 Hypothyroidism, unspecified: Secondary | ICD-10-CM | POA: Diagnosis not present

## 2024-01-31 DIAGNOSIS — H2511 Age-related nuclear cataract, right eye: Secondary | ICD-10-CM | POA: Diagnosis not present

## 2024-01-31 DIAGNOSIS — H40052 Ocular hypertension, left eye: Secondary | ICD-10-CM | POA: Diagnosis not present

## 2024-01-31 DIAGNOSIS — H31012 Macula scars of posterior pole (postinflammatory) (post-traumatic), left eye: Secondary | ICD-10-CM | POA: Diagnosis not present

## 2024-01-31 DIAGNOSIS — H21552 Recession of chamber angle, left eye: Secondary | ICD-10-CM | POA: Diagnosis not present

## 2024-02-24 ENCOUNTER — Telehealth: Payer: Self-pay | Admitting: Internal Medicine

## 2024-02-24 NOTE — Telephone Encounter (Unsigned)
 Copied from CRM [id number]. Topic: Clinical - Lab/Test Results >> Feb 24, 2024  1:08 PM Kristin Gentry wrote: Reason for CRM: Kristin Gentry w/ [facility] billing  called in to receive DX code frm 9/17 blood work. Please call [phone] Red # [phone]

## 2024-03-10 ENCOUNTER — Encounter: Payer: Self-pay | Admitting: Nurse Practitioner
